# Patient Record
Sex: Male | Born: 1945 | Race: White | Hispanic: No | Marital: Married | Smoking: Never smoker
Health system: Southern US, Community
[De-identification: ages and names within clinical notes are randomized; demographics above are authoritative.]

## PROBLEM LIST (undated history)

## (undated) DIAGNOSIS — R972 Elevated prostate specific antigen [PSA]: Secondary | ICD-10-CM

## (undated) DIAGNOSIS — M5030 Other cervical disc degeneration, unspecified cervical region: Secondary | ICD-10-CM

## (undated) DIAGNOSIS — M1990 Unspecified osteoarthritis, unspecified site: Secondary | ICD-10-CM

## (undated) DIAGNOSIS — C61 Malignant neoplasm of prostate: Secondary | ICD-10-CM

## (undated) DIAGNOSIS — I10 Essential (primary) hypertension: Secondary | ICD-10-CM

## (undated) DIAGNOSIS — Z8709 Personal history of other diseases of the respiratory system: Secondary | ICD-10-CM

## (undated) DIAGNOSIS — E785 Hyperlipidemia, unspecified: Secondary | ICD-10-CM

## (undated) DIAGNOSIS — G4733 Obstructive sleep apnea (adult) (pediatric): Secondary | ICD-10-CM

## (undated) DIAGNOSIS — Z87442 Personal history of urinary calculi: Secondary | ICD-10-CM

## (undated) HISTORY — PX: TONSILLECTOMY: SUR1361

## (undated) HISTORY — PX: COLONOSCOPY: SHX174

---

## 1997-12-09 ENCOUNTER — Ambulatory Visit (HOSPITAL_COMMUNITY): Admission: RE | Admit: 1997-12-09 | Discharge: 1997-12-09 | Payer: Self-pay | Admitting: Internal Medicine

## 1998-04-17 ENCOUNTER — Ambulatory Visit (HOSPITAL_COMMUNITY): Admission: RE | Admit: 1998-04-17 | Discharge: 1998-04-17 | Payer: Self-pay | Admitting: Internal Medicine

## 1998-05-30 ENCOUNTER — Ambulatory Visit (HOSPITAL_COMMUNITY): Admission: RE | Admit: 1998-05-30 | Discharge: 1998-05-30 | Payer: Self-pay | Admitting: Internal Medicine

## 2001-07-11 ENCOUNTER — Encounter: Payer: Self-pay | Admitting: Internal Medicine

## 2001-07-11 ENCOUNTER — Encounter: Admission: RE | Admit: 2001-07-11 | Discharge: 2001-07-11 | Payer: Self-pay | Admitting: Internal Medicine

## 2002-02-03 ENCOUNTER — Ambulatory Visit (HOSPITAL_COMMUNITY): Admission: RE | Admit: 2002-02-03 | Discharge: 2002-02-03 | Payer: Self-pay | Admitting: Gastroenterology

## 2016-05-09 ENCOUNTER — Other Ambulatory Visit: Payer: Self-pay | Admitting: Urology

## 2016-05-22 ENCOUNTER — Encounter (HOSPITAL_BASED_OUTPATIENT_CLINIC_OR_DEPARTMENT_OTHER): Payer: Self-pay | Admitting: *Deleted

## 2016-05-22 NOTE — Progress Notes (Signed)
NPO AFTER MN .  ARRIVE AT 1115.  NEEDS ISTAT AND EKG.  WILL DO FLEET ENEMA AM DOS .

## 2016-05-24 NOTE — H&P (Signed)
--------------------------------------------------------------------------------   Thomas Barnes  [id number]  PRIMARY CARE:  Precious Reel, MD  DOB: November 09, 1945, 70 year old Male  REFERRING:  Precious Reel, MD  SSN: -[id number]  PROVIDER:  Raynelle Bring, M.D.    LOCATION:  [facility] [phone]   --------------------------------------------------------------------------------   CC/HPI: Elevated PSA    Thomas Barnes is a 70 year old gentleman seen today at the request of Dr. Virgina Jock for an elevated PSA. His medical comorbidities include hypercholesterolemia, hypertension, sleep apnea, and gout. He has a paternal family history of prostate cancer with his father having been diagnosed with symptomatic metastatic prostate cancer at age 77 and died of metastatic prostate cancer at age 59. He has been undergoing routine PSA screening under the care of Dr. Virgina Jock. Although I do not have his PSAs prior to this year,his PSA was apparently in the 1 range at baseline in apparently around 2 last year. We will plan to obtain these results. His PSA further increased to 3.30 on 01/04/16 and then increased to 3.6 with a free percentage of 19.4% when rechecked on 04/01/16. He has never undergone a prior prostate biopsy. He does not take 5 alpha reductase inhibitor medication. He denies baseline voiding symptoms. IPSS is 0. He does have erectile dysfunction. SHIM score is 2.     ALLERGIES: Penicillin    MEDICATIONS: Allopurinol 300 mg tablet  Lisinopril 20 mg tablet  Simvastatin 40 mg tablet  Aspirin Ec 81 mg tablet, delayed release  Fish Oil 1,000 mg (120 mg-180 mg) capsule     GU PSH: None   NON-GU PSH: Tonsillectomy - 1954    GU PMH: None   NON-GU PMH: Essential (primary) hypertension Gout, unspecified Pure hypercholesterolemia, unspecified Sleep apnea, unspecified    FAMILY HISTORY: ovarian cancer - Mother prostate cancer in father - Father   SOCIAL HISTORY: Marital  Status: Married Current Smoking Status: Patient has never smoked.  Has never drank.  Does not drink caffeine.    REVIEW OF SYSTEMS:    GU Review Male:   Patient denies frequent urination, hard to postpone urination, burning/ pain with urination, get up at night to urinate, leakage of urine, stream starts and stops, trouble starting your streams, and have to strain to urinate .  Gastrointestinal (Lower):   Patient denies diarrhea and constipation.  Gastrointestinal (Upper):   Patient denies nausea and vomiting.  Constitutional:   Patient denies fever, night sweats, weight loss, and fatigue.  Skin:   Patient denies skin rash/ lesion and itching.  Eyes:   Patient denies blurred vision and double vision.  Ears/ Nose/ Throat:   Patient denies sinus problems and sore throat.  Hematologic/Lymphatic:   Patient denies swollen glands and easy bruising.  Cardiovascular:   Patient denies leg swelling and chest pains.  Respiratory:   Patient denies cough and shortness of breath.  Endocrine:   Patient denies excessive thirst.  Musculoskeletal:   Patient denies back pain and joint pain.  Neurological:   Patient denies headaches and dizziness.  Psychologic:   Patient denies depression and anxiety.   VITAL SIGNS:      04/18/2016 10:03 AM  Weight 230 lb / 104.33 kg  Height 70 in / 177.8 cm  BP 114/71 mmHg  Pulse 70 /min  BMI 33.0 kg/m   GU PHYSICAL EXAMINATION:    Prostate: 40 cc. There is induration noted toward the right medial base of the prostate that is mildly suspicious.   MULTI-SYSTEM PHYSICAL EXAMINATION:  Constitutional: Well-nourished. No physical deformities. Normally developed. Good grooming.  Neck: Neck symmetrical, not swollen. Normal tracheal position.  Respiratory: No labored breathing, no use of accessory muscles.   Cardiovascular: Normal temperature, normal extremity pulses, no swelling, no varicosities.  Lymphatic: No enlargement of neck, axillae, groin.  Skin: No paleness,  no jaundice, no cyanosis. No lesion, no ulcer, no rash.  Neurologic / Psychiatric: Oriented to time, oriented to place, oriented to person. No depression, no anxiety, no agitation.  Gastrointestinal: No mass, no tenderness, no rigidity, non obese abdomen.  Eyes: Normal conjunctivae. Normal eyelids.  Ears, Nose, Mouth, and Throat: Left ear no scars, no lesions, no masses. Right ear no scars, no lesions, no masses. Nose no scars, no lesions, no masses. Normal hearing. Normal lips.  Musculoskeletal: Normal gait and station of head and neck.     PAST DATA REVIEWED:  Source Of History:  Patient  Lab Test Review:   PSA  Records Review:   Previous Patient Records  Urine Test Review:   Urinalysis   PROCEDURES:          Urinalysis - 81003 Dipstick Dipstick Cont'd  Specimen: Voided Bilirubin: Neg  Color: Yellow Ketones: Neg  Appearance: Clear Blood: Neg  Specific Gravity: 1.020 Protein: Neg  pH: 5.5 Urobilinogen: 0.2  Glucose: Neg Nitrites: Neg    Leukocyte Esterase: Neg    ASSESSMENT:      ICD-10 Details  1 GU:   Elevated PSA - R97.20    PLAN:            Medications New Meds: Levaquin 500 mg tablet 1 tablet PO Daily take 1 the day before prostate biopsy, 1 the day of, and 1 the day after  #3  0 Refill(s)            Schedule Return Visit: 1 Month - TRUSP          Document Letter(s):  Created for Patient: Clinical Summary         Notes:   1. Elevated PSA: Based on his digital rectal exam and rising PSA as well as his family history of prostate cancer, I have recommended that he proceed with a transrectal ultrasound-guided prostate needle biopsy in the near future. We have reviewed the potential risks including but not limited to bleeding, infection, failure to diagnose cancer, voiding difficulties, etc. He wishes to proceed and this will be arranged and scheduled.   Cc: Dr. Shon Baton

## 2016-05-25 ENCOUNTER — Ambulatory Visit (HOSPITAL_COMMUNITY): Payer: Medicare Other

## 2016-05-25 ENCOUNTER — Ambulatory Visit (HOSPITAL_BASED_OUTPATIENT_CLINIC_OR_DEPARTMENT_OTHER): Payer: Medicare Other | Admitting: Anesthesiology

## 2016-05-25 ENCOUNTER — Encounter (HOSPITAL_BASED_OUTPATIENT_CLINIC_OR_DEPARTMENT_OTHER)
Admission: RE | Disposition: A | Discharge status: Home / Self Care | Payer: Self-pay | Source: Ambulatory Visit | Attending: Urology

## 2016-05-25 ENCOUNTER — Ambulatory Visit (HOSPITAL_BASED_OUTPATIENT_CLINIC_OR_DEPARTMENT_OTHER)
Admission: RE | Admit: 2016-05-25 | Discharge: 2016-05-25 | Disposition: A | Discharge status: Home / Self Care | Payer: Medicare Other | Source: Ambulatory Visit | Attending: Urology | Admitting: Urology

## 2016-05-25 ENCOUNTER — Encounter (HOSPITAL_BASED_OUTPATIENT_CLINIC_OR_DEPARTMENT_OTHER): Payer: Self-pay | Admitting: *Deleted

## 2016-05-25 DIAGNOSIS — M109 Gout, unspecified: Secondary | ICD-10-CM | POA: Diagnosis not present

## 2016-05-25 DIAGNOSIS — N400 Enlarged prostate without lower urinary tract symptoms: Secondary | ICD-10-CM

## 2016-05-25 DIAGNOSIS — I10 Essential (primary) hypertension: Secondary | ICD-10-CM | POA: Insufficient documentation

## 2016-05-25 DIAGNOSIS — Z8042 Family history of malignant neoplasm of prostate: Secondary | ICD-10-CM | POA: Diagnosis not present

## 2016-05-25 DIAGNOSIS — Z79899 Other long term (current) drug therapy: Secondary | ICD-10-CM | POA: Insufficient documentation

## 2016-05-25 DIAGNOSIS — E7800 Pure hypercholesterolemia, unspecified: Secondary | ICD-10-CM | POA: Diagnosis not present

## 2016-05-25 DIAGNOSIS — R972 Elevated prostate specific antigen [PSA]: Secondary | ICD-10-CM | POA: Diagnosis present

## 2016-05-25 HISTORY — PX: PROSTATE BIOPSY: SHX241

## 2016-05-25 HISTORY — DX: Obstructive sleep apnea (adult) (pediatric): G47.33

## 2016-05-25 HISTORY — DX: Hyperlipidemia, unspecified: E78.5

## 2016-05-25 HISTORY — DX: Essential (primary) hypertension: I10

## 2016-05-25 HISTORY — DX: Elevated prostate specific antigen (PSA): R97.20

## 2016-05-25 LAB — POCT I-STAT, CHEM 8
BUN: 17 mg/dL (ref 6–20)
Calcium, Ion: 1.23 mmol/L (ref 1.15–1.40)
Chloride: 104 mmol/L (ref 101–111)
Creatinine, Ser: 1 mg/dL (ref 0.61–1.24)
Glucose, Bld: 141 mg/dL — ABNORMAL HIGH (ref 65–99)
HCT: 44 % (ref 39.0–52.0)
Hemoglobin: 15 g/dL (ref 13.0–17.0)
Potassium: 4.5 mmol/L (ref 3.5–5.1)
Sodium: 141 mmol/L (ref 135–145)
TCO2: 25 mmol/L (ref 0–100)

## 2016-05-25 SURGERY — BIOPSY, PROSTATE, RECTAL APPROACH, WITH US GUIDANCE
Anesthesia: Monitor Anesthesia Care

## 2016-05-25 MED ORDER — LIDOCAINE HCL 2 % IJ SOLN
INTRAMUSCULAR | Status: DC | PRN
Start: 2016-05-25 — End: 2016-05-25
  Administered 2016-05-25: 20 mL

## 2016-05-25 MED ORDER — PROMETHAZINE HCL 25 MG/ML IJ SOLN
6.2500 mg | INTRAMUSCULAR | Status: DC | PRN
  Filled 2016-05-25: qty 1

## 2016-05-25 MED ORDER — PROPOFOL 500 MG/50ML IV EMUL
INTRAVENOUS | Status: DC | PRN
  Administered 2016-05-25: 200 ug/kg/min via INTRAVENOUS

## 2016-05-25 MED ORDER — FLEET ENEMA 7-19 GM/118ML RE ENEM
1.0000 | ENEMA | Freq: Once | RECTAL | Status: DC
  Filled 2016-05-25: qty 1

## 2016-05-25 MED ORDER — GENTAMICIN IN SALINE 1.6-0.9 MG/ML-% IV SOLN
80.0000 mg | INTRAVENOUS | Status: DC
  Filled 2016-05-25: qty 50

## 2016-05-25 MED ORDER — GENTAMICIN SULFATE 40 MG/ML IJ SOLN
5.0000 mg/kg | INTRAMUSCULAR | Status: AC
  Administered 2016-05-25: 430 mg via INTRAVENOUS
  Filled 2016-05-25 (×2): qty 10.75

## 2016-05-25 MED ORDER — LIDOCAINE HCL 2 % IJ SOLN
INTRAMUSCULAR | Status: AC
  Filled 2016-05-25: qty 20

## 2016-05-25 MED ORDER — DEXAMETHASONE SODIUM PHOSPHATE 10 MG/ML IJ SOLN
INTRAMUSCULAR | Status: AC
  Filled 2016-05-25: qty 1

## 2016-05-25 MED ORDER — BELLADONNA ALKALOIDS-OPIUM 16.2-60 MG RE SUPP
RECTAL | Status: AC
  Filled 2016-05-25: qty 1

## 2016-05-25 MED ORDER — LIDOCAINE HCL 2 % EX GEL
CUTANEOUS | Status: AC
Start: 2016-05-25 — End: 2016-05-25
  Filled 2016-05-25: qty 5

## 2016-05-25 MED ORDER — ONDANSETRON HCL 4 MG/2ML IJ SOLN
INTRAMUSCULAR | Status: AC
  Filled 2016-05-25: qty 2

## 2016-05-25 MED ORDER — DEXAMETHASONE SODIUM PHOSPHATE 4 MG/ML IJ SOLN
INTRAMUSCULAR | Status: DC | PRN
  Administered 2016-05-25: 10 mg via INTRAVENOUS

## 2016-05-25 MED ORDER — HYDROMORPHONE HCL 1 MG/ML IJ SOLN
0.2500 mg | INTRAMUSCULAR | Status: DC | PRN
  Filled 2016-05-25: qty 1

## 2016-05-25 MED ORDER — ONDANSETRON HCL 4 MG/2ML IJ SOLN: INTRAMUSCULAR | Status: DC | PRN

## 2016-05-25 MED ORDER — ONDANSETRON HCL 4 MG/2ML IJ SOLN
INTRAMUSCULAR | Status: DC | PRN
  Administered 2016-05-25: 4 mg via INTRAVENOUS

## 2016-05-25 MED ORDER — LIDOCAINE 2% (20 MG/ML) 5 ML SYRINGE
INTRAMUSCULAR | Status: DC | PRN
  Administered 2016-05-25: 50 mg via INTRAVENOUS

## 2016-05-25 MED ORDER — LIDOCAINE HCL 2 % EX GEL
CUTANEOUS | Status: DC | PRN
  Administered 2016-05-25: 1

## 2016-05-25 MED ORDER — FENTANYL CITRATE (PF) 100 MCG/2ML IJ SOLN
INTRAMUSCULAR | Status: AC
  Filled 2016-05-25: qty 2

## 2016-05-25 MED ORDER — MIDAZOLAM HCL 2 MG/2ML IJ SOLN
INTRAMUSCULAR | Status: AC
  Filled 2016-05-25: qty 2

## 2016-05-25 MED ORDER — LACTATED RINGERS IV SOLN
INTRAVENOUS | Status: DC
  Administered 2016-05-25: 11:00:00 via INTRAVENOUS
  Filled 2016-05-25: qty 1000

## 2016-05-25 MED ORDER — MIDAZOLAM HCL 5 MG/5ML IJ SOLN
INTRAMUSCULAR | Status: DC | PRN
  Administered 2016-05-25: 2 mg via INTRAVENOUS

## 2016-05-25 MED ORDER — FENTANYL CITRATE (PF) 100 MCG/2ML IJ SOLN
INTRAMUSCULAR | Status: DC | PRN
  Administered 2016-05-25: 25 ug via INTRAVENOUS

## 2016-05-25 SURGICAL SUPPLY — 13 items
DRSG TELFA 3X8 NADH (GAUZE/BANDAGES/DRESSINGS) ×3 IMPLANT
GLOVE BIO SURGEON STRL SZ7.5 (GLOVE) ×3 IMPLANT
INST BIOPSY MAXCORE 18GX25 (NEEDLE) ×3 IMPLANT
INSTR BIOPSY MAXCORE 18GX20 (NEEDLE) IMPLANT
KIT ROOM TURNOVER WOR (KITS) ×3 IMPLANT
NDL SAFETY ECLIPSE 18X1.5 (NEEDLE) ×1 IMPLANT
NEEDLE HYPO 18GX1.5 SHARP (NEEDLE) ×2
NEEDLE SPNL 22GX7 QUINCKE BK (NEEDLE) ×3 IMPLANT
SURGILUBE 2OZ TUBE FLIPTOP (MISCELLANEOUS) ×3 IMPLANT
SYR 20CC LL (SYRINGE) ×3 IMPLANT
TOWEL OR 17X24 6PK STRL BLUE (TOWEL DISPOSABLE) IMPLANT
UNDERPAD 30X30 INCONTINENT (UNDERPADS AND DIAPERS) ×6 IMPLANT
WATER STERILE IRR 500ML POUR (IV SOLUTION) IMPLANT

## 2016-05-25 NOTE — Anesthesia Procedure Notes (Signed)
Procedure Name: MAC Date/Time: 05/25/2016 12:45 PM Performed by: Wanita Chamberlain Pre-anesthesia Checklist: Patient identified, Timeout performed, Emergency Drugs available, Suction available and Patient being monitored Patient Re-evaluated:Patient Re-evaluated prior to inductionOxygen Delivery Method: Nasal cannula Preoxygenation: Pre-oxygenation with 100% oxygen Intubation Type: IV induction Placement Confirmation: positive ETCO2 and breath sounds checked- equal and bilateral Dental Injury: Teeth and Oropharynx as per pre-operative assessment

## 2016-05-25 NOTE — Anesthesia Postprocedure Evaluation (Signed)
Anesthesia Post Note  Patient: Thomas Barnes  Procedure(s) Performed: Procedure(s) (LRB): BIOPSY TRANSRECTAL ULTRASONIC PROSTATE (TUBP) (N/A)  Patient location during evaluation: PACU Anesthesia Type: General Level of consciousness: awake and alert and patient cooperative Pain management: pain level controlled Vital Signs Assessment: post-procedure vital signs reviewed and stable Respiratory status: spontaneous breathing and respiratory function stable Cardiovascular status: stable Anesthetic complications: no    Last Vitals:  Vitals:   05/25/16 1345 05/25/16 1400  BP: 122/71 137/86  Pulse: 65 66  Resp: 13 (!) 21  Temp:      Last Pain:  Vitals:   05/25/16 1106  TempSrc: Oral                 Hedy Garro S

## 2016-05-25 NOTE — Transfer of Care (Signed)
Immediate Anesthesia Transfer of Care Note  Patient: Thomas Barnes  Procedure(s) Performed: Procedure(s): BIOPSY TRANSRECTAL ULTRASONIC PROSTATE (TUBP) (N/A)  Patient Location: PACU  Anesthesia Type:MAC  Level of Consciousness: awake, alert , oriented and patient cooperative  Airway & Oxygen Therapy: Patient Spontanous Breathing and Patient connected to nasal cannula oxygen  Post-op Assessment: Report given to RN and Post -op Vital signs reviewed and stable  Post vital signs: Reviewed and stable  Last Vitals:  Vitals:   05/25/16 1106 05/25/16 1328  BP: 135/81 123/81  Pulse: 67 69  Resp: 16 10  Temp: 36.7 C (P) 36.3 C    Last Pain:  Vitals:   05/25/16 1106  TempSrc: Oral      Patients Stated Pain Goal: 3 ([id number])  Complications: No apparent anesthesia complications

## 2016-05-25 NOTE — Op Note (Signed)
Preoperative diagnosis: Elevated PSA, abnormal digital rectal exam  Postoperative diagnosis: Elevated PSA, abnormal digital rectal exam  Procedure: Transrectal ultrasound-guided prostate needle biopsy  Surgeon: Pryor Curia. M.D.  Resident: Dr. Cleotis Lema  Anesthesia: IV sedation  EBL: Minimal  Indication: Thomas Barnes is a 70 year old gentleman who is noted to have induration at the right medial base of the prostate and a rising PSA.  He presents today for a transrectal ultrasound-guided prostate needle biopsy.  We reviewed potential complications and risks and he gives informed consent.  Description of procedure:  The patient was taken to the operating room and IV sedation was administered.  He was placed in the left lateral decubitus position and administered topical lidocaine in the rectum.  Preoperative antibiotics were administered.  A preoperative timeout was performed.   The ultrasound probe was then inserted into the rectum and the prostate was visualized.  2% Xylocaine was injected at the junction of the prostate and seminal vesicles with a total of 5 cc on each side thereby resulting in a periprostatic nerve block.  Additional Xylocaine was injected at the apex and along the rectal wall.  The prostate was then examined and measured 56 cc.  There were scattered calcifications but no discrete abnormalities otherwise noted.  There was a small intravesical component of the prostate.  A 12 core biopsy was then obtained.  Systematic biopsies were obtained from the right and left prostate including the base, mid, and apical gland in both the lateral and parasagittal regions.  All biopsies were obtained under direct ultrasound guidance.  The patient tolerated the procedure well without complications.  All biopsies were sent for permanent pathologic analysis to the pathology lab in formalin.

## 2016-05-25 NOTE — Discharge Instructions (Signed)
Post Anesthesia Home Care Instructions  Activity: Get plenty of rest for the remainder of the day. A responsible adult should stay with you for 24 hours following the procedure.  For the next 24 hours, DO NOT: -Drive a car -Paediatric nurse -Drink alcoholic beverages -Take any medication unless instructed by your physician -Make any legal decisions or sign important papers.  Meals: Start with liquid foods such as gelatin or soup. Progress to regular foods as tolerated. Avoid greasy, spicy, heavy foods. If nausea and/or vomiting occur, drink only clear liquids until the nausea and/or vomiting subsides. Call your physician if vomiting continues.  Special Instructions/Symptoms: Your throat may feel dry or sore from the anesthesia or the breathing tube placed in your throat during surgery. If this causes discomfort, gargle with warm salt water. The discomfort should disappear within 24 hours.  Transrectal Ultrasound-Guided Biopsy A transrectal ultrasound-guided biopsy is a procedure to remove samples of tissue from your prostate using ultrasound images to guide the procedure. The procedure is usually done to evaluate the prostate gland of men who have an elevated prostate-specific antigen (PSA). PSA is a blood test to screen for prostate cancer. The biopsy samples are taken to check for prostate cancer.  LET [facility] CARE PROVIDER KNOW ABOUT:  Any allergies you have.  All medicines you are taking, including vitamins, herbs, eye drops, creams, and over-the-counter medicines.  Previous problems you or members of your family have had with the use of anesthetics.  Any blood disorders you have.  Previous surgeries you have had.  Medical conditions you have. RISKS AND COMPLICATIONS Generally, this is a safe procedure. However, as with any procedure, problems can occur. Possible problems include:  Infection of your prostate.  Bleeding from your rectum or blood in your  urine.  Difficulty urinating.  Nerve damage (this is usually temporary).  Damage to surrounding structures such as blood vessels, organs, and muscles, which would require other procedures. BEFORE THE PROCEDURE  Do not eat or drink anything after midnight on the night before the procedure or as directed by your health care provider.  Take medicines only as directed by your health care provider.  Your health care provider may have you stop taking certain medicines 5-7 days before the procedure.  You will be given an enema before the procedure. During an enema, a liquid is injected into your rectum to clear out waste.  You may have lab tests the day of your procedure.   Plan to have someone take you home after the procedure. PROCEDURE   You will be given medicine to help you relax (sedative) before the procedure. An IV tube will be inserted into one of your veins and used to give fluids and medicine.  You will be given antibiotic medicine to reduce the risk of an infection.  You will be placed on your side for the procedure.  A probe with lubricated gel will be placed into your rectum, and images will be taken of your prostate and surrounding structures.  Numbing medicine will be injected into the prostate before the biopsy samples are taken.  A biopsy needle will then be inserted and guided to your prostate with the use of the ultrasound images.  Samples of prostate tissue will be taken, and the needle will then be removed.  The biopsy samples will be sent to a lab to be analyzed. Results are usually back in 2-3 days. AFTER THE PROCEDURE  You will be taken to a recovery area where you  will be monitored.  You may have some discomfort in the rectal area. You will be given pain medicines to control this.  You may be allowed to go home the same day, or you may need to stay in the hospital overnight.   This information is not intended to replace advice given to you by your  health care provider. Make sure you discuss any questions you have with your health care provider.   Document Released: 12/22/2013 Document Revised: 08/28/2014 Document Reviewed: 12/22/2013 Elsevier Interactive Patient Education 2016 [employer]. Call if fever > 101 or excessive bleeding.

## 2016-05-25 NOTE — Anesthesia Preprocedure Evaluation (Addendum)
Anesthesia Evaluation  Patient identified by MRN, date of birth, ID band Patient awake    Reviewed: Allergy & Precautions, NPO status , Patient's Chart, lab work & pertinent test results  History of Anesthesia Complications Negative for: history of anesthetic complications  Airway Mallampati: III  TM Distance: >3 FB Neck ROM: Full    Dental no notable dental hx. (+) Dental Advisory Given, Teeth Intact   Pulmonary sleep apnea ,    Pulmonary exam normal        Cardiovascular Exercise Tolerance: Good hypertension, Pt. on medications Normal cardiovascular exam Rhythm:Regular Rate:Normal     Neuro/Psych negative neurological ROS  negative psych ROS   GI/Hepatic negative GI ROS, Neg liver ROS,   Endo/Other  negative endocrine ROS  Renal/GU      Musculoskeletal   Abdominal   Peds  Hematology   Anesthesia Other Findings   Reproductive/Obstetrics                          Anesthesia Physical Anesthesia Plan  ASA: II  Anesthesia Plan: MAC   Post-op Pain Management:    Induction:   Airway Management Planned: Natural Airway and Simple Face Mask  Additional Equipment:   Intra-op Plan:   Post-operative Plan:   Informed Consent: I have reviewed the patients History and Physical, chart, labs and discussed the procedure including the risks, benefits and alternatives for the proposed anesthesia with the patient or authorized representative who has indicated his/her understanding and acceptance.   Dental advisory given  Plan Discussed with: Anesthesiologist  Anesthesia Plan Comments:        Anesthesia Quick Evaluation

## 2016-05-26 ENCOUNTER — Encounter (HOSPITAL_BASED_OUTPATIENT_CLINIC_OR_DEPARTMENT_OTHER): Payer: Self-pay | Admitting: Urology

## 2017-05-17 ENCOUNTER — Ambulatory Visit: Payer: Self-pay | Admitting: Orthopedic Surgery

## 2017-05-17 NOTE — H&P (Signed)
Thomas Barnes DOB: [birth year] Married / Language: English / Race: White Male Date of Admission:  06/06/2017 CC: Left Hip Pain History of Present Illness The patient is a 71 year old male who comes in for a preoperative History and Physical. The patient is scheduled for a left total hip arthroplasty (anterior) to be performed by Dr. [name]. [name], MD at [facility] on 06-06-2017. The patient is being followed for their left hip pain and osteoarthritis. They are months out from IA injection with Dr.[name]. Symptoms reported include: pain, pain after sitting, catching, pain with weightbearing and difficulty ambulating. and report their pain level to be mild to moderate. The following medication has been used for pain control: Aleve. The patient has not gotten any relief of their symptoms with Cortisone injections. Mr. Thomas Barnes has been diagnosed with some osteoarthritis of the left hip in the past. He had an intra-articular injection in January by Dr. [name] which relieved his pain for about 3-4 months. He had a second injection this past May did not help as much. He has been having pain for about a year now. He denies any pain at rest or at night. There is no pain in the hip when he sitting in his recliner. He does have pain upon transferring to a standing position but it will ease off a little bit with some activity. He does have more pain if he has long periods of inactivity. Pain with weightbearing and standing and more recently it was quite severe prompting him to call him for this appointment today. He has been using some Aleve over-the-counter the past couple of days which has helped a little bit.  Radiographs-AP pelvis and lateral of the LEFT hip show that he has now progressed to bone-on-bone changes in the LEFT hip. The arthritis has progressed significantly since last December. He is having more functional problems now in the last injection did not help much. He and his wife  are both very concerned with the radiographic progression and also given that that he did not have a good response to the last injection, they opted to go ahead and proceed with total hip arthroplasty. They have been treated conservatively in the past for the above stated problem and despite conservative measures, they continue to have progressive pain and severe functional limitations and dysfunction. They have failed non-operative management including home exercise, medications, and injections. It is felt that they would benefit from undergoing total joint replacement. Risks and benefits of the procedure have been discussed with the patient and they elect to proceed with surgery. There are no active contraindications to surgery such as ongoing infection or rapidly progressive neurological disease.   Problem List/Past Medical  Primary osteoarthritis of left hip (M16.12)  Gout  High blood pressure  Hypercholesterolemia  Sleep Apnea  Elevated PSA  Measles  Mumps     Allergies  PENICILLIN [02/17/2011]: Hives.  Family History Cancer  Father, Mother. Heart Disease  Father. Hypertension  Father.  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 08/11/2016 Post-Surgical Plans  Home With Family.  Medication History Simvastatin (40MG  Tablet, Oral) Active. Lisinopril (20MG  Tablet, Oral) Active. Allopurinol (300MG  Tablet, Oral) Active. Aspirin (81MG  Tablet, 1 (  one) Oral) Active. Fish Oil Concentrate (435MG  Capsule, 1 (one) Oral) Active.  Past Surgical History Tonsillectomy   Review of Systems General Not Present- Chills, Fatigue, Fever, Memory Loss, Night Sweats, Weight  Gain and Weight Loss. Skin Not Present- Eczema, Hives, Itching, Lesions and Rash. HEENT Not Present- Dentures, Double Vision, Headache, Hearing Loss, Tinnitus and Visual Loss. Respiratory Not Present- Allergies, Chronic Cough, Coughing up blood, Shortness of breath at rest and Shortness of breath with exertion. Cardiovascular Not Present- Chest Pain, Difficulty Breathing Lying Down, Murmur, Palpitations, Racing/skipping heartbeats and Swelling. Gastrointestinal Not Present- Abdominal Pain, Bloody Stool, Constipation, Diarrhea, Difficulty Swallowing, Heartburn, Jaundice, Loss of appetitie, Nausea and Vomiting. Male Genitourinary Not Present- Blood in Urine, Discharge, Flank Pain, Incontinence, Painful Urination, Urgency, Urinary frequency, Urinary Retention, Urinating at Night and Weak urinary stream. Musculoskeletal Present- Joint Pain. Not Present- Back Pain, Joint Swelling, Morning Stiffness, Muscle Pain, Muscle Weakness and Spasms. Neurological Not Present- Blackout spells, Difficulty with balance, Dizziness, Paralysis, Tremor and Weakness. Psychiatric Not Present- Insomnia.  Vitals Weight: 212 lb Height: 70in Body Surface Area: 2.14 m Body Mass Index: 30.42 kg/m  Pulse: 60 (Regular)  BP: 122/62 (Sitting, Right Arm, Standard)       Physical Exam  General Mental Status -Alert, cooperative and good historian. General Appearance-pleasant, Not in acute distress. Orientation-Oriented X3. Build & Nutrition-Well nourished and Well developed.  Head and Neck Head-normocephalic, atraumatic . Neck Global Assessment - supple, no bruit auscultated on the right, no bruit auscultated on the left.  Eye Pupil - Bilateral-Regular and Round. Motion - Bilateral-EOMI.  Chest and Lung Exam Auscultation Breath sounds - clear at anterior chest wall and clear at posterior chest wall. Adventitious sounds - No Adventitious sounds.  Cardiovascular Auscultation Rhythm -  Regular rate and rhythm. Heart Sounds - S1 WNL and S2 WNL. Murmurs & Other Heart Sounds - Auscultation of the heart reveals - No Murmurs.  Abdomen Palpation/Percussion Tenderness - Abdomen is non-tender to palpation. Rigidity (guarding) - Abdomen is soft. Auscultation Auscultation of the abdomen reveals - Bowel sounds normal.  Male Genitourinary Note: Not done, not pertinent to present illness   Musculoskeletal Note: Examination of the right hip shows flexion to 120 rotation in 30 abduction 40 and external rotation of 40. There is no tenderness over the greater trochanter. There is no pain on provocative testing of the hip. His LEFT hip can be flexed to 100 rotate and 5 out 30 and abduct 30 with discomfort on range of motion. He does not have any trochanteric tenderness. He has an antalgic gait pattern on the LEFT.  Radiographs-AP pelvis and lateral of the LEFT hip show that he has now progressed to bone-on-bone changes in the LEFT hip. This is a substantial progression compared to his x-rays from last December.   Assessment & Plan Primary osteoarthritis of left hip (M16.12)  Note:Surgical Plans: Left Total Hip Replacement - Anterior Approach  Disposition: Home with HHPT  PCP: Dr. Virgina Jock  IV TXA  Anesthesia Issues: None  Patient was instructed on what medications to stop prior to surgery.  Signed electronically by Joelene Millin, III PA-C

## 2017-05-22 ENCOUNTER — Ambulatory Visit: Payer: Self-pay | Admitting: Orthopedic Surgery

## 2017-05-29 NOTE — Patient Instructions (Signed)
Thomas Barnes  05/29/2017   Your procedure is scheduled on: 06/06/2017    Report to [facility] Main  Entrance   Report to admitting at   0900 AM   Call this number if you have problems the morning of surgery  [phone]   Remember: ONLY 1 PERSON MAY GO WITH YOU TO SHORT STAY TO GET  READY MORNING OF YOUR SURGERY.  Do not eat food or drink liquids :After Midnight.     Take these medicines the morning of surgery with A SIP OF WATER: Allopurinol                                 You may not have any metal on your body including hair pins and           .              Men may shave face and neck.   Do not bring valuables to the hospital. [place].  Contacts, dentures or bridgework may not be worn into surgery.  Leave suitcase in the car. After surgery it may be brought to your room.                   Please read over the following fact sheets you were given: _____________________________________________________________________             [facility] - Preparing for Surgery Before surgery, you can play an important role.  Because skin is not sterile, your skin needs to be as free of germs as possible.  You can reduce the number of germs on your skin by washing with CHG (chlorahexidine gluconate) soap before surgery.  CHG is an antiseptic cleaner which kills germs and bonds with the skin to continue killing germs even after washing. Please DO NOT use if you have an allergy to CHG or antibacterial soaps.  If your skin becomes reddened/irritated stop using the CHG and inform your nurse when you arrive at Short Stay. Do not shave (including legs and underarms) for at least 48 hours prior to the first CHG shower.  You may shave your face/neck. Please follow these instructions carefully:  1.  Shower with CHG Soap the night before surgery and the  morning of Surgery.  2.  If you choose to wash your hair, wash  your hair first as usual with your  normal  shampoo.  3.  After you shampoo, rinse your hair and body thoroughly to remove the  shampoo.                           4.  Use CHG as you would any other liquid soap.  You can apply chg directly  to the skin and wash                       Gently with a scrungie or clean washcloth.  5.  Apply the CHG Soap to your body ONLY FROM THE NECK DOWN.   Do not use on face/ open  Wound or open sores. Avoid contact with eyes, ears mouth and genitals (private parts).                       Wash face,  Genitals (private parts) with your normal soap.             6.  Wash thoroughly, paying special attention to the area where your surgery  will be performed.  7.  Thoroughly rinse your body with warm water from the neck down.  8.  DO NOT shower/wash with your normal soap after using and rinsing off  the CHG Soap.                9.  Pat yourself dry with a clean towel.            10.  Wear clean pajamas.            11.  Place clean sheets on your bed the night of your first shower and do not  sleep with pets. Day of Surgery : Do not apply any lotions/deodorants the morning of surgery.  Please wear clean clothes to the hospital/surgery center.  FAILURE TO FOLLOW THESE INSTRUCTIONS MAY RESULT IN THE CANCELLATION OF YOUR SURGERY PATIENT SIGNATURE_________________________________  NURSE SIGNATURE__________________________________  ________________________________________________________________________  WHAT IS A BLOOD TRANSFUSION? Blood Transfusion Information  A transfusion is the replacement of blood or some of its parts. Blood is made up of multiple cells which provide different functions.  Red blood cells carry oxygen and are used for blood loss replacement.  White blood cells fight against infection.  Platelets control bleeding.  Plasma helps clot blood.  Other blood products are available for specialized needs, such as hemophilia  or other clotting disorders. BEFORE THE TRANSFUSION  Who gives blood for transfusions?   Healthy volunteers who are fully evaluated to make sure their blood is safe. This is blood bank blood. Transfusion therapy is the safest it has ever been in the practice of medicine. Before blood is taken from a donor, a complete history is taken to make sure that person has no history of diseases nor engages in risky social behavior (examples are intravenous drug use or sexual activity with multiple partners). The donor's travel history is screened to minimize risk of transmitting infections, such as malaria. The donated blood is tested for signs of infectious diseases, such as HIV and hepatitis. The blood is then tested to be sure it is compatible with you in order to minimize the chance of a transfusion reaction. If you or a relative donates blood, this is often done in anticipation of surgery and is not appropriate for emergency situations. It takes many days to process the donated blood. RISKS AND COMPLICATIONS Although transfusion therapy is very safe and saves many lives, the main dangers of transfusion include:   Getting an infectious disease.  Developing a transfusion reaction. This is an allergic reaction to something in the blood you were given. Every precaution is taken to prevent this. The decision to have a blood transfusion has been considered carefully by your caregiver before blood is given. Blood is not given unless the benefits outweigh the risks. AFTER THE TRANSFUSION  Right after receiving a blood transfusion, you will usually feel much better and more energetic. This is especially true if your red blood cells have gotten low (anemic). The transfusion raises the level of the red blood cells which carry oxygen, and this usually causes an energy increase.  The  nurse administering the transfusion will monitor you carefully for complications. HOME CARE INSTRUCTIONS  No special instructions are  needed after a transfusion. You may find your energy is better. Speak with your caregiver about any limitations on activity for underlying diseases you may have. SEEK MEDICAL CARE IF:   Your condition is not improving after your transfusion.  You develop redness or irritation at the intravenous (IV) site. SEEK IMMEDIATE MEDICAL CARE IF:  Any of the following symptoms occur over the next 12 hours:  Shaking chills.  You have a temperature by mouth above 102 F (38.9 C), not controlled by medicine.  Chest, back, or muscle pain.  People around you feel you are not acting correctly or are confused.  Shortness of breath or difficulty breathing.  Dizziness and fainting.  You get a rash or develop hives.  You have a decrease in urine output.  Your urine turns a dark color or changes to pink, red, or brown. Any of the following symptoms occur over the next 10 days:  You have a temperature by mouth above 102 F (38.9 C), not controlled by medicine.  Shortness of breath.  Weakness after normal activity.  The [place]roz part of the eye turns yellow (jaundice).  You have a decrease in the amount of urine or are urinating less often.  Your urine turns a dark color or changes to pink, red, or brown. Document Released: 08/04/2000 Document Revised: 10/30/2011 Document Reviewed: 03/23/2008 ExitCare Patient Information 2014 [place].  _______________________________________________________________________  Incentive Spirometer  An incentive spirometer is a tool that can help keep your lungs clear and active. This tool measures how well you are filling your lungs with each breath. Taking long deep breaths may help reverse or decrease the chance of developing breathing (pulmonary) problems (especially infection) following:  A long period of time when you are unable to move or be active. BEFORE THE PROCEDURE   If the spirometer includes an indicator to show your best effort, your  nurse or respiratory therapist will set it to a desired goal.  If possible, sit up straight or lean slightly forward. Try not to slouch.  Hold the incentive spirometer in an upright position. INSTRUCTIONS FOR USE  1. Sit on the edge of your bed if possible, or sit up as far as you can in bed or on a chair. 2. Hold the incentive spirometer in an upright position. 3. Breathe out normally. 4. Place the mouthpiece in your mouth and seal your lips tightly around it. 5. Breathe in slowly and as deeply as possible, raising the piston or the ball toward the top of the column. 6. Hold your breath for 3-5 seconds or for as long as possible. Allow the piston or ball to fall to the bottom of the column. 7. Remove the mouthpiece from your mouth and breathe out normally. 8. Rest for a few seconds and repeat Steps 1 through 7 at least 10 times every 1-2 hours when you are awake. Take your time and take a few normal breaths between deep breaths. 9. The spirometer may include an indicator to show your best effort. Use the indicator as a goal to work toward during each repetition. 10. After each set of 10 deep breaths, practice coughing to be sure your lungs are clear. If you have an incision (the cut made at the time of surgery), support your incision when coughing by placing a pillow or rolled up towels firmly against it. Once you are able to get  out of bed, walk around indoors and cough well. You may stop using the incentive spirometer when instructed by your caregiver.  RISKS AND COMPLICATIONS  Take your time so you do not get dizzy or light-headed.  If you are in pain, you may need to take or ask for pain medication before doing incentive spirometry. It is harder to take a deep breath if you are having pain. AFTER USE  Rest and breathe slowly and easily.  It can be helpful to keep track of a log of your progress. Your caregiver can provide you with a simple table to help with this. If you are using the  spirometer at home, follow these instructions: [place] IF:   You are having difficultly using the spirometer.  You have trouble using the spirometer as often as instructed.  Your pain medication is not giving enough relief while using the spirometer.  You develop fever of 100.5 F (38.1 C) or higher. SEEK IMMEDIATE MEDICAL CARE IF:   You cough up bloody sputum that had not been present before.  You develop fever of 102 F (38.9 C) or greater.  You develop worsening pain at or near the incision site. MAKE SURE YOU:   Understand these instructions.  Will watch your condition.  Will get help right away if you are not doing well or get worse. Document Released: 12/18/2006 Document Revised: 10/30/2011 Document Reviewed: 02/18/2007 [facility] Patient Information 2014 [place], [place].   ________________________________________________________________________

## 2017-05-30 ENCOUNTER — Encounter (HOSPITAL_COMMUNITY)
Admission: RE | Admit: 2017-05-30 | Discharge: 2017-05-30 | Disposition: A | Discharge status: Home / Self Care | Payer: Medicare Other | Source: Ambulatory Visit | Attending: Orthopedic Surgery | Admitting: Orthopedic Surgery

## 2017-05-30 ENCOUNTER — Encounter (HOSPITAL_COMMUNITY): Payer: Self-pay

## 2017-05-30 DIAGNOSIS — M1612 Unilateral primary osteoarthritis, left hip: Secondary | ICD-10-CM | POA: Insufficient documentation

## 2017-05-30 DIAGNOSIS — R001 Bradycardia, unspecified: Secondary | ICD-10-CM | POA: Diagnosis not present

## 2017-05-30 DIAGNOSIS — Z01812 Encounter for preprocedural laboratory examination: Secondary | ICD-10-CM | POA: Diagnosis not present

## 2017-05-30 DIAGNOSIS — Z01810 Encounter for preprocedural cardiovascular examination: Secondary | ICD-10-CM | POA: Diagnosis not present

## 2017-05-30 HISTORY — DX: Unspecified osteoarthritis, unspecified site: M19.90

## 2017-05-30 LAB — PROTIME-INR
INR: 0.99
Prothrombin Time: 13 seconds (ref 11.4–15.2)

## 2017-05-30 LAB — COMPREHENSIVE METABOLIC PANEL
ALT: 16 U/L — ABNORMAL LOW (ref 17–63)
AST: 18 U/L (ref 15–41)
Albumin: 4.5 g/dL (ref 3.5–5.0)
Alkaline Phosphatase: 79 U/L (ref 38–126)
Anion gap: 11 (ref 5–15)
BUN: 15 mg/dL (ref 6–20)
CO2: 24 mmol/L (ref 22–32)
Calcium: 9.4 mg/dL (ref 8.9–10.3)
Chloride: 103 mmol/L (ref 101–111)
Creatinine, Ser: 0.97 mg/dL (ref 0.61–1.24)
GFR calc Af Amer: >60 mL/min (ref >60)
GFR calc non Af Amer: >60 mL/min (ref >60)
Glucose, Bld: 136 mg/dL — ABNORMAL HIGH (ref 65–99)
Potassium: 4.5 mmol/L (ref 3.5–5.1)
Sodium: 138 mmol/L (ref 135–145)
Total Bilirubin: 0.6 mg/dL (ref 0.3–1.2)
Total Protein: 7.4 g/dL (ref 6.5–8.1)

## 2017-05-30 LAB — CBC
HCT: 40.2 % (ref 39.0–52.0)
Hemoglobin: 14.1 g/dL (ref 13.0–17.0)
MCH: 28.4 pg (ref 26.0–34.0)
MCHC: 35.1 g/dL (ref 30.0–36.0)
MCV: 81 fL (ref 78.0–100.0)
Platelets: 279 10*3/uL (ref 150–400)
RBC: 4.96 MIL/uL (ref 4.22–5.81)
RDW: 13.7 % (ref 11.5–15.5)
WBC: 5.8 10*3/uL (ref 4.0–10.5)

## 2017-05-30 LAB — SURGICAL PCR SCREEN
MRSA, PCR: NEGATIVE
Staphylococcus aureus: NEGATIVE

## 2017-05-30 LAB — APTT: aPTT: 31 seconds (ref 24–36)

## 2017-05-30 LAB — ABO/RH: ABO/RH(D): A POS

## 2017-05-30 NOTE — Progress Notes (Signed)
Clearance dated 01/12/2017- Dr Virgina Jock on chart LOV - Dr Virgina Jock- 01/12/2017 on chart

## 2017-05-30 NOTE — Progress Notes (Signed)
Called and requesed t LOV note from Dr Virgina Jock and clearance for surgery.  Office to fax.

## 2017-05-30 NOTE — Progress Notes (Signed)
Final EKG done 05/30/17-epic

## 2017-06-06 ENCOUNTER — Encounter (HOSPITAL_COMMUNITY)
Admission: RE | Disposition: A | Discharge status: Home Health | Payer: Self-pay | Source: Ambulatory Visit | Attending: Orthopedic Surgery

## 2017-06-06 ENCOUNTER — Encounter (HOSPITAL_COMMUNITY): Payer: Self-pay | Admitting: Certified Registered"

## 2017-06-06 ENCOUNTER — Inpatient Hospital Stay (HOSPITAL_COMMUNITY)
Admission: RE | Admit: 2017-06-06 | Discharge: 2017-06-08 | DRG: 470 | Disposition: A | Discharge status: Home Health | Payer: Medicare Other | Source: Ambulatory Visit | Attending: Orthopedic Surgery | Admitting: Orthopedic Surgery

## 2017-06-06 ENCOUNTER — Inpatient Hospital Stay (HOSPITAL_COMMUNITY): Payer: Medicare Other

## 2017-06-06 ENCOUNTER — Inpatient Hospital Stay (HOSPITAL_COMMUNITY): Payer: Medicare Other | Admitting: Certified Registered"

## 2017-06-06 DIAGNOSIS — M1612 Unilateral primary osteoarthritis, left hip: Principal | ICD-10-CM | POA: Diagnosis present

## 2017-06-06 DIAGNOSIS — E7800 Pure hypercholesterolemia, unspecified: Secondary | ICD-10-CM | POA: Diagnosis present

## 2017-06-06 DIAGNOSIS — M109 Gout, unspecified: Secondary | ICD-10-CM | POA: Diagnosis present

## 2017-06-06 DIAGNOSIS — Z880 Allergy status to penicillin: Secondary | ICD-10-CM

## 2017-06-06 DIAGNOSIS — M169 Osteoarthritis of hip, unspecified: Secondary | ICD-10-CM | POA: Diagnosis present

## 2017-06-06 DIAGNOSIS — Z8249 Family history of ischemic heart disease and other diseases of the circulatory system: Secondary | ICD-10-CM | POA: Diagnosis not present

## 2017-06-06 DIAGNOSIS — Z7982 Long term (current) use of aspirin: Secondary | ICD-10-CM

## 2017-06-06 DIAGNOSIS — I10 Essential (primary) hypertension: Secondary | ICD-10-CM | POA: Diagnosis present

## 2017-06-06 DIAGNOSIS — Z8619 Personal history of other infectious and parasitic diseases: Secondary | ICD-10-CM

## 2017-06-06 DIAGNOSIS — E785 Hyperlipidemia, unspecified: Secondary | ICD-10-CM | POA: Diagnosis present

## 2017-06-06 DIAGNOSIS — G4733 Obstructive sleep apnea (adult) (pediatric): Secondary | ICD-10-CM | POA: Diagnosis present

## 2017-06-06 DIAGNOSIS — Z96649 Presence of unspecified artificial hip joint: Secondary | ICD-10-CM

## 2017-06-06 HISTORY — PX: TOTAL HIP ARTHROPLASTY: SHX124

## 2017-06-06 LAB — TYPE AND SCREEN
ABO/RH(D): A POS
Antibody Screen: NEGATIVE

## 2017-06-06 SURGERY — ARTHROPLASTY, HIP, TOTAL, ANTERIOR APPROACH
Anesthesia: Spinal | Site: Hip | Laterality: Left

## 2017-06-06 MED ORDER — MORPHINE SULFATE (PF) 4 MG/ML IV SOLN: 1.0000 mg | INTRAVENOUS | Status: DC | PRN

## 2017-06-06 MED ORDER — SODIUM CHLORIDE 0.9 % IR SOLN
Status: DC | PRN
  Administered 2017-06-06: 1000 mL

## 2017-06-06 MED ORDER — DEXAMETHASONE SODIUM PHOSPHATE 10 MG/ML IJ SOLN
10.0000 mg | Freq: Once | INTRAMUSCULAR | Status: AC
Start: 2017-06-06 — End: 2017-06-06
  Administered 2017-06-06: 10 mg via INTRAVENOUS

## 2017-06-06 MED ORDER — LACTATED RINGERS IV SOLN
INTRAVENOUS | Status: DC
  Administered 2017-06-06 (×3): via INTRAVENOUS

## 2017-06-06 MED ORDER — PROPOFOL 500 MG/50ML IV EMUL
INTRAVENOUS | Status: DC | PRN
Start: 2017-06-06 — End: 2017-06-06
  Administered 2017-06-06: 75 ug/kg/min via INTRAVENOUS

## 2017-06-06 MED ORDER — MIDAZOLAM HCL 2 MG/2ML IJ SOLN
INTRAMUSCULAR | Status: DC | PRN
  Administered 2017-06-06: 2 mg via INTRAVENOUS

## 2017-06-06 MED ORDER — DOCUSATE SODIUM 100 MG PO CAPS
100.0000 mg | ORAL_CAPSULE | Freq: Two times a day (BID) | ORAL | Status: DC
  Administered 2017-06-06 – 2017-06-08 (×4): 100 mg via ORAL
  Filled 2017-06-06 (×4): qty 1

## 2017-06-06 MED ORDER — ACETAMINOPHEN 10 MG/ML IV SOLN
1000.0000 mg | Freq: Once | INTRAVENOUS | Status: AC
  Administered 2017-06-06: 1000 mg via INTRAVENOUS

## 2017-06-06 MED ORDER — TRANEXAMIC ACID 1000 MG/10ML IV SOLN
1000.0000 mg | Freq: Once | INTRAVENOUS | Status: AC
  Administered 2017-06-06: 1000 mg via INTRAVENOUS
  Filled 2017-06-06: qty 1100

## 2017-06-06 MED ORDER — LIDOCAINE 2% (20 MG/ML) 5 ML SYRINGE
INTRAMUSCULAR | Status: DC | PRN
  Administered 2017-06-06: 40 mg via INTRAVENOUS

## 2017-06-06 MED ORDER — METOCLOPRAMIDE HCL 5 MG PO TABS: 5.0000 mg | ORAL_TABLET | Freq: Three times a day (TID) | ORAL | Status: DC | PRN

## 2017-06-06 MED ORDER — HYDROMORPHONE HCL-NACL 0.5-0.9 MG/ML-% IV SOSY
PREFILLED_SYRINGE | INTRAVENOUS | Status: AC
  Filled 2017-06-06: qty 2

## 2017-06-06 MED ORDER — ACETAMINOPHEN 500 MG PO TABS
1000.0000 mg | ORAL_TABLET | Freq: Four times a day (QID) | ORAL | Status: AC
  Administered 2017-06-06 – 2017-06-07 (×4): 1000 mg via ORAL
  Filled 2017-06-06 (×4): qty 2

## 2017-06-06 MED ORDER — FLEET ENEMA 7-19 GM/118ML RE ENEM: 1.0000 | ENEMA | Freq: Once | RECTAL | Status: DC | PRN

## 2017-06-06 MED ORDER — ALBUMIN HUMAN 5 % IV SOLN
INTRAVENOUS | Status: AC
  Filled 2017-06-06: qty 500

## 2017-06-06 MED ORDER — ONDANSETRON HCL 4 MG PO TABS: 4.0000 mg | ORAL_TABLET | Freq: Four times a day (QID) | ORAL | Status: DC | PRN

## 2017-06-06 MED ORDER — POLYETHYLENE GLYCOL 3350 17 G PO PACK: 17.0000 g | PACK | Freq: Every day | ORAL | Status: DC | PRN

## 2017-06-06 MED ORDER — STERILE WATER FOR IRRIGATION IR SOLN
Status: DC | PRN
  Administered 2017-06-06: 3000 mL

## 2017-06-06 MED ORDER — SIMVASTATIN 20 MG PO TABS
40.0000 mg | ORAL_TABLET | Freq: Every morning | ORAL | Status: DC
  Administered 2017-06-07 – 2017-06-08 (×2): 40 mg via ORAL
  Filled 2017-06-06 (×2): qty 2

## 2017-06-06 MED ORDER — ONDANSETRON HCL 4 MG/2ML IJ SOLN
INTRAMUSCULAR | Status: DC | PRN
Start: 2017-06-06 — End: 2017-06-06
  Administered 2017-06-06: 4 mg via INTRAVENOUS

## 2017-06-06 MED ORDER — PROPOFOL 10 MG/ML IV BOLUS
INTRAVENOUS | Status: DC | PRN
  Administered 2017-06-06 (×2): 20 mg via INTRAVENOUS
  Administered 2017-06-06: 10 mg via INTRAVENOUS
  Administered 2017-06-06 (×4): 20 mg via INTRAVENOUS

## 2017-06-06 MED ORDER — VANCOMYCIN HCL IN DEXTROSE 1-5 GM/200ML-% IV SOLN
INTRAVENOUS | Status: AC
  Administered 2017-06-06: 1000 mg via INTRAVENOUS
  Filled 2017-06-06: qty 200

## 2017-06-06 MED ORDER — PROMETHAZINE HCL 25 MG/ML IJ SOLN: 6.2500 mg | INTRAMUSCULAR | Status: DC | PRN

## 2017-06-06 MED ORDER — RIVAROXABAN 10 MG PO TABS
10.0000 mg | ORAL_TABLET | Freq: Every day | ORAL | Status: DC
  Administered 2017-06-07 – 2017-06-08 (×2): 10 mg via ORAL
  Filled 2017-06-06 (×2): qty 1

## 2017-06-06 MED ORDER — PROPOFOL 10 MG/ML IV BOLUS
INTRAVENOUS | Status: AC
  Filled 2017-06-06: qty 40

## 2017-06-06 MED ORDER — METHOCARBAMOL 500 MG PO TABS
500.0000 mg | ORAL_TABLET | Freq: Four times a day (QID) | ORAL | Status: DC | PRN
Start: 2017-06-06 — End: 2017-06-08
  Filled 2017-06-06: qty 1

## 2017-06-06 MED ORDER — PROPOFOL 10 MG/ML IV BOLUS
INTRAVENOUS | Status: AC
  Filled 2017-06-06: qty 20

## 2017-06-06 MED ORDER — MIDAZOLAM HCL 2 MG/2ML IJ SOLN
INTRAMUSCULAR | Status: AC
  Filled 2017-06-06: qty 2

## 2017-06-06 MED ORDER — OXYCODONE HCL 5 MG PO TABS
5.0000 mg | ORAL_TABLET | ORAL | Status: DC | PRN
  Administered 2017-06-06: 5 mg via ORAL
  Administered 2017-06-06: 10 mg via ORAL
  Administered 2017-06-06: 16:00:00 5 mg via ORAL
  Administered 2017-06-07: 10 mg via ORAL
  Administered 2017-06-07: 5 mg via ORAL
  Administered 2017-06-08 (×2): 10 mg via ORAL
  Filled 2017-06-06 (×2): qty 1
  Filled 2017-06-06 (×5): qty 2
  Filled 2017-06-06: qty 1

## 2017-06-06 MED ORDER — MENTHOL 3 MG MT LOZG: 1.0000 | LOZENGE | OROMUCOSAL | Status: DC | PRN

## 2017-06-06 MED ORDER — LIDOCAINE HCL (PF) 2 % IJ SOLN
INTRAMUSCULAR | Status: AC
  Filled 2017-06-06: qty 10

## 2017-06-06 MED ORDER — FENTANYL CITRATE (PF) 250 MCG/5ML IJ SOLN
INTRAMUSCULAR | Status: AC
  Filled 2017-06-06: qty 5

## 2017-06-06 MED ORDER — SODIUM CHLORIDE 0.9 % IV SOLN
1000.0000 mg | INTRAVENOUS | Status: AC
  Administered 2017-06-06: 1000 mg via INTRAVENOUS
  Filled 2017-06-06: qty 1100

## 2017-06-06 MED ORDER — PHENYLEPHRINE HCL 10 MG/ML IJ SOLN
INTRAMUSCULAR | Status: DC | PRN
  Administered 2017-06-06: 40 ug/min via INTRAVENOUS

## 2017-06-06 MED ORDER — ALBUMIN HUMAN 5 % IV SOLN
INTRAVENOUS | Status: DC | PRN
  Administered 2017-06-06 (×2): via INTRAVENOUS

## 2017-06-06 MED ORDER — HYDROMORPHONE HCL-NACL 0.5-0.9 MG/ML-% IV SOSY
0.2500 mg | PREFILLED_SYRINGE | INTRAVENOUS | Status: DC | PRN
  Administered 2017-06-06 (×4): 0.5 mg via INTRAVENOUS

## 2017-06-06 MED ORDER — ACETAMINOPHEN 325 MG PO TABS: 650.0000 mg | ORAL_TABLET | Freq: Four times a day (QID) | ORAL | Status: DC | PRN

## 2017-06-06 MED ORDER — VANCOMYCIN HCL IN DEXTROSE 1-5 GM/200ML-% IV SOLN
1000.0000 mg | INTRAVENOUS | Status: AC
  Administered 2017-06-06: 1000 mg via INTRAVENOUS

## 2017-06-06 MED ORDER — ACETAMINOPHEN 650 MG RE SUPP: 650.0000 mg | Freq: Four times a day (QID) | RECTAL | Status: DC | PRN

## 2017-06-06 MED ORDER — BISACODYL 10 MG RE SUPP: 10.0000 mg | Freq: Every day | RECTAL | Status: DC | PRN

## 2017-06-06 MED ORDER — CHLORHEXIDINE GLUCONATE 4 % EX LIQD: 60.0000 mL | Freq: Once | CUTANEOUS | Status: DC

## 2017-06-06 MED ORDER — PHENOL 1.4 % MT LIQD: 1.0000 | OROMUCOSAL | Status: DC | PRN

## 2017-06-06 MED ORDER — ACETAMINOPHEN 10 MG/ML IV SOLN
INTRAVENOUS | Status: AC
  Filled 2017-06-06: qty 100

## 2017-06-06 MED ORDER — DEXAMETHASONE SODIUM PHOSPHATE 10 MG/ML IJ SOLN
10.0000 mg | Freq: Once | INTRAMUSCULAR | Status: AC
  Administered 2017-06-07: 10 mg via INTRAVENOUS
  Filled 2017-06-06: qty 1

## 2017-06-06 MED ORDER — BUPIVACAINE HCL (PF) 0.25 % IJ SOLN
INTRAMUSCULAR | Status: DC | PRN
Start: 2017-06-06 — End: 2017-06-06
  Administered 2017-06-06: 30 mL

## 2017-06-06 MED ORDER — VANCOMYCIN HCL IN DEXTROSE 1-5 GM/200ML-% IV SOLN
1000.0000 mg | Freq: Two times a day (BID) | INTRAVENOUS | Status: AC
  Administered 2017-06-06: 23:00:00 1000 mg via INTRAVENOUS
  Filled 2017-06-06: qty 200

## 2017-06-06 MED ORDER — ONDANSETRON HCL 4 MG/2ML IJ SOLN: 4.0000 mg | Freq: Four times a day (QID) | INTRAMUSCULAR | Status: DC | PRN

## 2017-06-06 MED ORDER — DIPHENHYDRAMINE HCL 12.5 MG/5ML PO ELIX: 12.5000 mg | ORAL_SOLUTION | ORAL | Status: DC | PRN

## 2017-06-06 MED ORDER — ALLOPURINOL 300 MG PO TABS
300.0000 mg | ORAL_TABLET | ORAL | Status: DC
  Administered 2017-06-07 – 2017-06-08 (×2): 300 mg via ORAL
  Filled 2017-06-06 (×2): qty 1

## 2017-06-06 MED ORDER — FENTANYL CITRATE (PF) 250 MCG/5ML IJ SOLN
INTRAMUSCULAR | Status: DC | PRN
  Administered 2017-06-06 (×2): 50 ug via INTRAVENOUS

## 2017-06-06 MED ORDER — METHOCARBAMOL 1000 MG/10ML IJ SOLN
500.0000 mg | Freq: Four times a day (QID) | INTRAVENOUS | Status: DC | PRN
  Administered 2017-06-06: 500 mg via INTRAVENOUS
  Filled 2017-06-06: qty 550

## 2017-06-06 MED ORDER — SODIUM CHLORIDE 0.9 % IV SOLN
INTRAVENOUS | Status: DC
  Administered 2017-06-06: 16:00:00 100 mL/h via INTRAVENOUS
  Administered 2017-06-06: 23:00:00 via INTRAVENOUS

## 2017-06-06 MED ORDER — METOCLOPRAMIDE HCL 5 MG/ML IJ SOLN: 5.0000 mg | Freq: Three times a day (TID) | INTRAMUSCULAR | Status: DC | PRN

## 2017-06-06 MED ORDER — TRAMADOL HCL 50 MG PO TABS: 50.0000 mg | ORAL_TABLET | Freq: Four times a day (QID) | ORAL | Status: DC | PRN

## 2017-06-06 MED ORDER — PHENYLEPHRINE 40 MCG/ML (10ML) SYRINGE FOR IV PUSH (FOR BLOOD PRESSURE SUPPORT)
PREFILLED_SYRINGE | INTRAVENOUS | Status: DC | PRN
  Administered 2017-06-06: 120 ug via INTRAVENOUS
  Administered 2017-06-06: 160 ug via INTRAVENOUS
  Administered 2017-06-06: 120 ug via INTRAVENOUS

## 2017-06-06 SURGICAL SUPPLY — 37 items
BAG DECANTER FOR FLEXI CONT (MISCELLANEOUS) ×3 IMPLANT
BAG ZIPLOCK 12X15 (MISCELLANEOUS) IMPLANT
BLADE SAG 18X100X1.27 (BLADE) ×3 IMPLANT
CAPT HIP TOTAL 2 ×3 IMPLANT
CLOSURE WOUND 1/2 X4 (GAUZE/BANDAGES/DRESSINGS) ×1
CLOTH BEACON ORANGE TIMEOUT ST (SAFETY) ×3 IMPLANT
COVER PERINEAL POST (MISCELLANEOUS) ×3 IMPLANT
COVER SURGICAL LIGHT HANDLE (MISCELLANEOUS) ×3 IMPLANT
CUP ACETBLR 52 OD PINNACLE (Hips) ×3 IMPLANT
DECANTER SPIKE VIAL GLASS SM (MISCELLANEOUS) ×3 IMPLANT
DRAPE STERI IOBAN 125X83 (DRAPES) ×3 IMPLANT
DRAPE U-SHAPE 47X51 STRL (DRAPES) ×6 IMPLANT
DRSG ADAPTIC 3X8 NADH LF (GAUZE/BANDAGES/DRESSINGS) ×3 IMPLANT
DRSG MEPILEX BORDER 4X4 (GAUZE/BANDAGES/DRESSINGS) ×3 IMPLANT
DRSG MEPILEX BORDER 4X8 (GAUZE/BANDAGES/DRESSINGS) ×3 IMPLANT
DURAPREP 26ML APPLICATOR (WOUND CARE) ×3 IMPLANT
ELECT REM PT RETURN 15FT ADLT (MISCELLANEOUS) ×3 IMPLANT
EVACUATOR 1/8 PVC DRAIN (DRAIN) ×3 IMPLANT
GLOVE BIO SURGEON STRL SZ7.5 (GLOVE) ×3 IMPLANT
GLOVE BIO SURGEON STRL SZ8 (GLOVE) ×6 IMPLANT
GLOVE BIOGEL PI IND STRL 7.5 (GLOVE) ×6 IMPLANT
GLOVE BIOGEL PI IND STRL 8 (GLOVE) ×2 IMPLANT
GLOVE BIOGEL PI INDICATOR 7.5 (GLOVE) ×12
GLOVE BIOGEL PI INDICATOR 8 (GLOVE) ×4
GOWN STRL REUS W/TWL LRG LVL3 (GOWN DISPOSABLE) ×3 IMPLANT
GOWN STRL REUS W/TWL XL LVL3 (GOWN DISPOSABLE) ×3 IMPLANT
PACK ANTERIOR HIP CUSTOM (KITS) ×3 IMPLANT
STRIP CLOSURE SKIN 1/2X4 (GAUZE/BANDAGES/DRESSINGS) ×2 IMPLANT
SUT ETHIBOND NAB CT1 #1 30IN (SUTURE) ×3 IMPLANT
SUT MNCRL AB 4-0 PS2 18 (SUTURE) ×3 IMPLANT
SUT STRATAFIX 0 PDS 27 VIOLET (SUTURE) ×3
SUT VIC AB 2-0 CT1 27 (SUTURE) ×4
SUT VIC AB 2-0 CT1 TAPERPNT 27 (SUTURE) ×2 IMPLANT
SUTURE STRATFX 0 PDS 27 VIOLET (SUTURE) ×1 IMPLANT
SYR 50ML LL SCALE MARK (SYRINGE) IMPLANT
TRAY FOLEY W/METER SILVER 16FR (SET/KITS/TRAYS/PACK) ×3 IMPLANT
YANKAUER SUCT BULB TIP 10FT TU (MISCELLANEOUS) ×3 IMPLANT

## 2017-06-06 NOTE — Anesthesia Postprocedure Evaluation (Signed)
Anesthesia Post Note  Patient: Thomas Barnes  Procedure(s) Performed: LEFT TOTAL HIP ARTHROPLASTY ANTERIOR APPROACH (Left Hip)     Patient location during evaluation: PACU Anesthesia Type: Spinal Level of consciousness: awake and alert Pain management: pain level controlled Vital Signs Assessment: post-procedure vital signs reviewed and stable Respiratory status: spontaneous breathing and respiratory function stable Cardiovascular status: blood pressure returned to baseline and stable Postop Assessment: spinal receding Anesthetic complications: no    Last Vitals:  Vitals:   06/06/17 1430 06/06/17 1445  BP: 101/75 100/65  Pulse: (!) 58 66  Resp: 12 (!) 9  Temp:    SpO2: 94% 97%    Last Pain:  Vitals:   06/06/17 1415  TempSrc:   PainSc: Thomas Barnes

## 2017-06-06 NOTE — Anesthesia Preprocedure Evaluation (Addendum)
Anesthesia Evaluation  Patient identified by MRN, date of birth, ID band Patient awake    Reviewed: Allergy & Precautions, NPO status , Patient's Chart, lab work & pertinent test results  Airway Mallampati: II  TM Distance: >3 FB Neck ROM: Full    Dental  (+) Dental Advisory Given   Pulmonary sleep apnea ,    breath sounds clear to auscultation       Cardiovascular hypertension, Pt. on medications (-) angina Rhythm:Regular Rate:Normal     Neuro/Psych negative neurological ROS     GI/Hepatic negative GI ROS, Neg liver ROS,   Endo/Other  negative endocrine ROS  Renal/GU negative Renal ROS     Musculoskeletal  (+) Arthritis ,   Abdominal   Peds  Hematology negative hematology ROS (+)   Anesthesia Other Findings   Reproductive/Obstetrics                            Lab Results  Component Value Date   WBC 5.8 05/30/2017   HGB 14.1 05/30/2017   HCT 40.2 05/30/2017   MCV 81.0 05/30/2017   PLT 279 05/30/2017   Lab Results  Component Value Date   INR 0.99 05/30/2017   Lab Results  Component Value Date   CREATININE 0.97 05/30/2017   BUN 15 05/30/2017   NA 138 05/30/2017   K 4.5 05/30/2017   CL 103 05/30/2017   CO2 24 05/30/2017    Anesthesia Physical Anesthesia Plan  ASA: II  Anesthesia Plan: Spinal   Post-op Pain Management:    Induction:   PONV Risk Score and Plan: 2 and Ondansetron, Dexamethasone and Treatment may vary due to age or medical condition  Airway Management Planned: Natural Airway and Simple Face Mask  Additional Equipment:   Intra-op Plan:   Post-operative Plan:   Informed Consent: I have reviewed the patients History and Physical, chart, labs and discussed the procedure including the risks, benefits and alternatives for the proposed anesthesia with the patient or authorized representative who has indicated his/her understanding and acceptance.      Plan Discussed with: CRNA  Anesthesia Plan Comments:         Anesthesia Quick Evaluation

## 2017-06-06 NOTE — Anesthesia Procedure Notes (Addendum)
Spinal  Patient location during procedure: OR Start time: 06/06/2017 10:57 AM End time: 06/06/2017 11:02 AM Staffing Anesthesiologist: Suzette Battiest Performed: anesthesiologist  Preanesthetic Checklist Completed: patient identified, site marked, surgical consent, pre-op evaluation, timeout performed, IV checked, risks and benefits discussed and monitors and equipment checked Spinal Block Patient position: sitting Prep: ChloraPrep and site prepped and draped Patient monitoring: blood pressure, continuous pulse ox and heart rate Approach: midline Location: L4-5 Injection technique: single-shot Needle Needle type: Pencan  Needle gauge: 24 G Needle length: 9 cm Needle insertion depth: 7 cm

## 2017-06-06 NOTE — Interval H&P Note (Signed)
History and Physical Interval Note:  06/06/2017 9:42 AM  Thomas Barnes  has presented today for surgery, with the diagnosis of Osteoarthritis Left Hip  The various methods of treatment have been discussed with the patient and family. After consideration of risks, benefits and other options for treatment, the patient has consented to  Procedure(s): LEFT TOTAL HIP ARTHROPLASTY ANTERIOR APPROACH (Left) as a surgical intervention .  The patient's history has been reviewed, patient examined, no change in status, stable for surgery.  I have reviewed the patient's chart and labs.  Questions were answered to the patient's satisfaction.     Gearlean Alf

## 2017-06-06 NOTE — Op Note (Signed)
OPERATIVE REPORT- TOTAL HIP ARTHROPLASTY   PREOPERATIVE DIAGNOSIS: Osteoarthritis of the Left hip.   POSTOPERATIVE DIAGNOSIS: Osteoarthritis of the Left  hip.   PROCEDURE: Left total hip arthroplasty, anterior approach.   SURGEON: Gaynelle Arabian, MD   ASSISTANT: Arlee Muslim, PA-C  ANESTHESIA:  Spinal  ESTIMATED BLOOD LOSS:-800 mL    DRAINS: Hemovac x1.   COMPLICATIONS: None   CONDITION: PACU - hemodynamically stable.   BRIEF CLINICAL NOTE: Thomas Barnes is a 71 y.o. male who has advanced end-  stage arthritis of their Left  hip with progressively worsening pain and  dysfunction.The patient has failed nonoperative management and presents for  total hip arthroplasty.   PROCEDURE IN DETAIL: After successful administration of spinal  anesthetic, the traction boots for the [facility] bed were placed on both  feet and the patient was placed onto the [facility] bed, boots placed into the leg  holders. The Left hip was then isolated from the perineum with plastic  drapes and prepped and draped in the usual sterile fashion. ASIS and  greater trochanter were marked and a oblique incision was made, starting  at about 1 cm lateral and 2 cm distal to the ASIS and coursing towards  the anterior cortex of the femur. The skin was cut with a 10 blade  through subcutaneous tissue to the level of the fascia overlying the  tensor fascia lata muscle. The fascia was then incised in line with the  incision at the junction of the anterior third and posterior 2/3rd. The  muscle was teased off the fascia and then the interval between the TFL  and the rectus was developed. The Hohmann retractor was then placed at  the top of the femoral neck over the capsule. The vessels overlying the  capsule were cauterized and the fat on top of the capsule was removed.  A Hohmann retractor was then placed anterior underneath the rectus  femoris to give exposure to the entire anterior capsule. A T-shaped   capsulotomy was performed. The edges were tagged and the femoral head  was identified.       Osteophytes are removed off the superior acetabulum.  The femoral neck was then cut in situ with an oscillating saw. Traction  was then applied to the left lower extremity utilizing the [facility]  traction. The femoral head was then removed. Retractors were placed  around the acetabulum and then circumferential removal of the labrum was  performed. Osteophytes were also removed. Reaming starts at 47 mm to  medialize and  Increased in 2 mm increments to 51 mm. We reamed in  approximately 40 degrees of abduction, 20 degrees anteversion. A 52 mm  pinnacle acetabular shell was then impacted in anatomic position under  fluoroscopic guidance with excellent purchase. We did not need to place  any additional dome screws. A 32 mm neutral + 4 marathon liner was then  placed into the acetabular shell.       The femoral lift was then placed along the lateral aspect of the femur  just distal to the vastus ridge. The leg was  externally rotated and capsule  was stripped off the inferior aspect of the femoral neck down to the  level of the lesser trochanter, this was done with electrocautery. The femur was lifted after this was performed. The  leg was then placed in an extended and adducted position essentially delivering the femur. We also removed the capsule superiorly and the piriformis from the piriformis  fossa to gain excellent exposure of the  proximal femur. Rongeur was used to remove some cancellous bone to get  into the lateral portion of the proximal femur for placement of the  initial starter reamer. The starter broaches was placed  the starter broach  and was shown to go down the center of the canal. Broaching  with the  Corail system was then performed starting at size 8, coursing  Up to size 16. A size 16 had excellent torsional and rotational  and axial stability. The trial high offset neck was then  placed  with a 32 + 5 trial head. The hip was then reduced. We confirmed that  the stem was in the canal both on AP and lateral x-rays. It also has excellent sizing. The hip was reduced with outstanding stability through full extension and full external rotation.. AP pelvis was taken and the leg lengths were measured and found to be equal. Hip was then dislocated again and the femoral head and neck removed. The  femoral broach was removed. Size 16 Cor[place] stem with a high offset  neck was then impacted into the femur following native anteversion. Has  excellent purchase in the canal. Excellent torsional and rotational and  axial stability. It is confirmed to be in the canal on AP and lateral  fluoroscopic views. The 32 + 5 ceramic head was placed and the hip  reduced with outstanding stability. Again AP pelvis was taken and it  confirmed that the leg lengths were equal. The wound was then copiously  irrigated with saline solution and the capsule reattached and repaired  with Ethibond suture. 30 ml of .25% Bupivicaine was  injected into the capsule and into the edge of the tensor fascia lata as well as subcutaneous tissue. The fascia overlying the tensor fascia lata was then closed with a running #1 V-Loc. Subcu was closed with interrupted 2-0 Vicryl and subcuticular running 4-0 Monocryl. Incision was cleaned  and dried. Steri-Strips and a bulky sterile dressing applied. Hemovac  drain was hooked to suction and then the patient was awakened and transported to  recovery in stable condition.        Please note that a surgical assistant was a medical necessity for this procedure to perform it in a safe and expeditious manner. Assistant was necessary to provide appropriate retraction of vital neurovascular structures and to prevent femoral fracture and allow for anatomic placement of the prosthesis.  Gaynelle Arabian, M.D.

## 2017-06-06 NOTE — Discharge Instructions (Addendum)
° °Dr. Frank [name] °Total Joint Specialist °[facility] °[address] °[place], [place] [address] °[phone] ° °ANTERIOR APPROACH TOTAL HIP REPLACEMENT POSTOPERATIVE DIRECTIONS ° ° °Hip Rehabilitation, Guidelines Following Surgery  °The results of a hip operation are greatly improved after range of motion and muscle strengthening exercises. Follow all safety measures which are given to protect your hip. If any of these exercises cause increased pain or swelling in your joint, decrease the amount until you are comfortable again. Then slowly increase the exercises. Call your caregiver if you have problems or questions.  ° °HOME CARE INSTRUCTIONS  °Remove items at home which could result in a fall. This includes throw rugs or furniture in walking pathways.  °· ICE to the affected hip every three hours for 30 minutes at a time and then as needed for pain and swelling.  Continue to use ice on the hip for pain and swelling from surgery. You may notice swelling that will progress down to the foot and ankle.  This is normal after surgery.  Elevate the leg when you are not up walking on it.   °· Continue to use the breathing machine which will help keep your temperature down.  It is common for your temperature to cycle up and down following surgery, especially at night when you are not up moving around and exerting yourself.  The breathing machine keeps your lungs expanded and your temperature down. ° ° °DIET °You may resume your previous home diet once your are discharged from the hospital. ° °DRESSING / WOUND CARE / SHOWERING °You may shower 3 days after surgery, but keep the wounds dry during showering.  You may use an occlusive plastic wrap (Press'n Seal for example), NO SOAKING/SUBMERGING IN THE BATHTUB.  If the bandage gets wet, change with a clean dry gauze.  If the incision gets wet, pat the wound dry with a clean towel. °You may start showering once you are discharged home but do not  submerge the incision under water. Just pat the incision dry and apply a dry gauze dressing on daily. °Change the surgical dressing daily and reapply a dry dressing each time. ° °ACTIVITY °Walk with your walker as instructed. °Use walker as long as suggested by your caregivers. °Avoid periods of inactivity such as sitting longer than an hour when not asleep. This helps prevent blood clots.  °You may resume a sexual relationship in one month or when given the OK by your doctor.  °You may return to work once you are cleared by your doctor.  °Do not drive a car for 6 weeks or until released by you surgeon.  °Do not drive while taking narcotics. ° °WEIGHT BEARING °Weight bearing as tolerated with assist device (walker, cane, etc) as directed, use it as long as suggested by your surgeon or therapist, typically at least 4-6 weeks. ° °POSTOPERATIVE CONSTIPATION PROTOCOL °Constipation - defined medically as fewer than three stools per week and severe constipation as less than one stool per week. ° °One of the most common issues patients have following surgery is constipation.  Even if you have a regular bowel pattern at home, your normal regimen is likely to be disrupted due to multiple reasons following surgery.  Combination of anesthesia, postoperative narcotics, change in appetite and fluid intake all can affect your bowels.  In order to avoid complications following surgery, here are some recommendations in order to help you during your recovery period. ° °Colace (docusate) - Pick up an over-the-counter   form of Colace or another stool softener and take twice a day as long as you are requiring postoperative pain medications.  Take with a full glass of water daily.  If you experience loose stools or diarrhea, hold the colace until you stool forms back up.  If your symptoms do not get better within 1 week or if they get worse, check with your doctor. ° °Dulcolax (bisacodyl) - Pick up over-the-counter and take as directed  by the product packaging as needed to assist with the movement of your bowels.  Take with a full glass of water.  Use this product as needed if not relieved by Colace only.  ° °MiraLax (polyethylene glycol) - Pick up over-the-counter to have on hand.  MiraLax is a solution that will increase the amount of water in your bowels to assist with bowel movements.  Take as directed and can mix with a glass of water, juice, soda, coffee, or tea.  Take if you go more than two days without a movement. °Do not use MiraLax more than once per day. Call your doctor if you are still constipated or irregular after using this medication for 7 days in a row. ° °If you continue to have problems with postoperative constipation, please contact the office for further assistance and recommendations.  If you experience "the worst abdominal pain ever" or develop nausea or vomiting, please contact the office immediatly for further recommendations for treatment. ° °ITCHING ° If you experience itching with your medications, try taking only a single pain pill, or even half a pain pill at a time.  You can also use Benadryl over the counter for itching or also to help with sleep.  ° °TED HOSE STOCKINGS °Wear the elastic stockings on both legs for three weeks following surgery during the day but you may remove then at night for sleeping. ° °MEDICATIONS °See your medication summary on the “After Visit Summary” that the nursing staff will review with you prior to discharge.  You may have some home medications which will be placed on hold until you complete the course of blood thinner medication.  It is important for you to complete the blood thinner medication as prescribed by your surgeon.  Continue your approved medications as instructed at time of discharge. ° °PRECAUTIONS °If you experience chest pain or shortness of breath - call 911 immediately for transfer to the hospital emergency department.  °If you develop a fever greater that 101 F,  purulent drainage from wound, increased redness or drainage from wound, foul odor from the wound/dressing, or calf pain - CONTACT YOUR SURGEON.   °                                                °FOLLOW-UP APPOINTMENTS °Make sure you keep all of your appointments after your operation with your surgeon and caregivers. You should call the office at the above phone number and make an appointment for approximately two weeks after the date of your surgery or on the date instructed by your surgeon outlined in the "After Visit Summary". ° °RANGE OF MOTION AND STRENGTHENING EXERCISES  °These exercises are designed to help you keep full movement of your hip joint. Follow your caregiver's or physical therapist's instructions. Perform all exercises about fifteen times, three times per day or as directed. Exercise both hips, even if you   have had only one joint replacement. These exercises can be done on a training (exercise) mat, on the floor, on a table or on a bed. Use whatever works the best and is most comfortable for you. Use music or television while you are exercising so that the exercises are a pleasant break in your day. This will make your life better with the exercises acting as a break in routine you can look forward to.  Lying on your back, slowly slide your foot toward your buttocks, raising your knee up off the floor. Then slowly slide your foot back down until your leg is straight again.  Lying on your back spread your legs as far apart as you can without causing discomfort.  Lying on your side, raise your upper leg and foot straight up from the floor as far as is comfortable. Slowly lower the leg and repeat.  Lying on your back, tighten up the muscle in the front of your thigh (quadriceps muscles). You can do this by keeping your leg straight and trying to raise your heel off the floor. This helps strengthen the largest muscle supporting your knee.  Lying on your back, tighten up the muscles of your  buttocks both with the legs straight and with the knee bent at a comfortable angle while keeping your heel on the floor.   IF YOU ARE TRANSFERRED TO A SKILLED REHAB FACILITY If the patient is transferred to a skilled rehab facility following release from the hospital, a list of the current medications will be sent to the facility for the patient to continue.  When discharged from the skilled rehab facility, please have the facility set up the patient's [place] prior to being released. Also, the skilled facility will be responsible for providing the patient with their medications at time of release from the facility to include their pain medication, the muscle relaxants, and their blood thinner medication. If the patient is still at the rehab facility at time of the two week follow up appointment, the skilled rehab facility will also need to assist the patient in arranging follow up appointment in our office and any transportation needs.  MAKE SURE YOU:  Understand these instructions.  Get help right away if you are not doing well or get worse.    Pick up stool softner and laxative for home use following surgery while on pain medications. Do not submerge incision under water. Please use good hand washing techniques while changing dressing each day. May shower starting three days after surgery. Please use a clean towel to pat the incision dry following showers. Continue to use ice for pain and swelling after surgery. Do not use any lotions or creams on the incision until instructed by your surgeon.  Take Xarelto for two and a half more weeks following discharge from the hospital, then discontinue Xarelto. Once the patient has completed the Xarelto, they may resume the 81 mg Aspirin.    Information on my medicine - XARELTO (Rivaroxaban)  Why was Xarelto prescribed for you? Xarelto was prescribed for you to reduce the risk of blood clots forming after orthopedic surgery.  The medical term for these abnormal blood clots is venous thromboembolism (VTE).  What do you need to know about xarelto ? Take your Xarelto ONCE DAILY at the same time every day. You may take it either with or without food.  If you have difficulty swallowing the tablet whole, you may crush it and mix in applesauce just prior  to taking your dose.  Take Xarelto exactly as prescribed by your doctor and DO NOT stop taking Xarelto without talking to the doctor who prescribed the medication.  Stopping without other VTE prevention medication to take the place of Xarelto may increase your risk of developing a clot.  After discharge, you should have regular check-up appointments with your healthcare provider that is prescribing your Xarelto.    What do you do if you miss a dose? If you miss a dose, take it as soon as you remember on the same day then continue your regularly scheduled once daily regimen the next day. Do not take two doses of Xarelto on the same day.   Important Safety Information A possible side effect of Xarelto is bleeding. You should call your healthcare provider right away if you experience any of the following: ? Bleeding from an injury or your nose that does not stop. ? Unusual colored urine (red or dark brown) or unusual colored stools (red or black). ? Unusual bruising for unknown reasons. ? A serious fall or if you hit your head (even if there is no bleeding).  Some medicines may interact with Xarelto and might increase your risk of bleeding while on Xarelto. To help avoid this, consult your healthcare provider or pharmacist prior to using any new prescription or non-prescription medications, including herbals, vitamins, non-steroidal anti-inflammatory drugs (NSAIDs) and supplements.  This website has more information on Xarelto: [email or web address]

## 2017-06-06 NOTE — Transfer of Care (Signed)
Immediate Anesthesia Transfer of Care Note  Patient: Thomas Barnes  Procedure(s) Performed: LEFT TOTAL HIP ARTHROPLASTY ANTERIOR APPROACH (Left Hip)  Patient Location: PACU  Anesthesia Type:Spinal  Level of Consciousness: awake and alert   Airway & Oxygen Therapy: Patient Spontanous Breathing and Patient connected to face mask oxygen  Post-op Assessment: Report given to RN and Post -op Vital signs reviewed and stable  Post vital signs: Reviewed and stable  Last Vitals:  Vitals:   06/06/17 0950  BP: 124/84  Pulse: 62  Resp: 16  Temp: 36.8 C  SpO2: 98%    Last Pain:  Vitals:   06/06/17 0950  TempSrc: Oral         Complications: No apparent anesthesia complications

## 2017-06-06 NOTE — H&P (View-Only) (Signed)
Thomas Barnes DOB: 03/01/1946 Married / Language: English / Race: White Male Date of Admission:  06/06/2017 CC: Left Hip Pain History of Present Illness The patient is a 71 year old male who comes in for a preoperative History and Physical. The patient is scheduled for a left total hip arthroplasty (anterior) to be performed by Dr. [name]. [name], MD at [facility] on 06-06-2017. The patient is being followed for their left hip pain and osteoarthritis. They are months out from IA injection with Dr.[name]. Symptoms reported include: pain, pain after sitting, catching, pain with weightbearing and difficulty ambulating. and report their pain level to be mild to moderate. The following medication has been used for pain control: Aleve. The patient has not gotten any relief of their symptoms with Cortisone injections. Mr. Thomas Barnes has been diagnosed with some osteoarthritis of the left hip in the past. He had an intra-articular injection in January by Dr. [name] which relieved his pain for about 3-4 months. He had a second injection this past May did not help as much. He has been having pain for about a year now. He denies any pain at rest or at night. There is no pain in the hip when he sitting in his recliner. He does have pain upon transferring to a standing position but it will ease off a little bit with some activity. He does have more pain if he has long periods of inactivity. Pain with weightbearing and standing and more recently it was quite severe prompting him to call him for this appointment today. He has been using some Aleve over-the-counter the past couple of days which has helped a little bit.  Radiographs-AP pelvis and lateral of the LEFT hip show that he has now progressed to bone-on-bone changes in the LEFT hip. The arthritis has progressed significantly since last December. He is having more functional problems now in the last injection did not help much. He and his wife  are both very concerned with the radiographic progression and also given that that he did not have a good response to the last injection, they opted to go ahead and proceed with total hip arthroplasty. They have been treated conservatively in the past for the above stated problem and despite conservative measures, they continue to have progressive pain and severe functional limitations and dysfunction. They have failed non-operative management including home exercise, medications, and injections. It is felt that they would benefit from undergoing total joint replacement. Risks and benefits of the procedure have been discussed with the patient and they elect to proceed with surgery. There are no active contraindications to surgery such as ongoing infection or rapidly progressive neurological disease.   Problem List/Past Medical  Primary osteoarthritis of left hip (M16.12)  Gout  High blood pressure  Hypercholesterolemia  Sleep Apnea  Elevated PSA  Measles  Mumps     Allergies  PENICILLIN [02/17/2011]: Hives.  Family History Cancer  Father, Mother. Heart Disease  Father. Hypertension  Father.  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 08/11/2016 Post-Surgical Plans  Home With Family.  Medication History Simvastatin (40MG  Tablet, Oral) Active. Lisinopril (20MG  Tablet, Oral) Active. Allopurinol (300MG  Tablet, Oral) Active. Aspirin (81MG  Tablet, 1 (  one) Oral) Active. Fish Oil Concentrate (435MG  Capsule, 1 (one) Oral) Active.  Past Surgical History Tonsillectomy   Review of Systems General Not Present- Chills, Fatigue, Fever, Memory Loss, Night Sweats, Weight  Gain and Weight Loss. Skin Not Present- Eczema, Hives, Itching, Lesions and Rash. HEENT Not Present- Dentures, Double Vision, Headache, Hearing Loss, Tinnitus and Visual Loss. Respiratory Not Present- Allergies, Chronic Cough, Coughing up blood, Shortness of breath at rest and Shortness of breath with exertion. Cardiovascular Not Present- Chest Pain, Difficulty Breathing Lying Down, Murmur, Palpitations, Racing/skipping heartbeats and Swelling. Gastrointestinal Not Present- Abdominal Pain, Bloody Stool, Constipation, Diarrhea, Difficulty Swallowing, Heartburn, Jaundice, Loss of appetitie, Nausea and Vomiting. Male Genitourinary Not Present- Blood in Urine, Discharge, Flank Pain, Incontinence, Painful Urination, Urgency, Urinary frequency, Urinary Retention, Urinating at Night and Weak urinary stream. Musculoskeletal Present- Joint Pain. Not Present- Back Pain, Joint Swelling, Morning Stiffness, Muscle Pain, Muscle Weakness and Spasms. Neurological Not Present- Blackout spells, Difficulty with balance, Dizziness, Paralysis, Tremor and Weakness. Psychiatric Not Present- Insomnia.  Vitals Weight: 212 lb Height: 70in Body Surface Area: 2.14 m Body Mass Index: 30.42 kg/m  Pulse: 60 (Regular)  BP: 122/62 (Sitting, Right Arm, Standard)       Physical Exam  General Mental Status -Alert, cooperative and good historian. General Appearance-pleasant, Not in acute distress. Orientation-Oriented X3. Build & Nutrition-Well nourished and Well developed.  Head and Neck Head-normocephalic, atraumatic . Neck Global Assessment - supple, no bruit auscultated on the right, no bruit auscultated on the left.  Eye Pupil - Bilateral-Regular and Round. Motion - Bilateral-EOMI.  Chest and Lung Exam Auscultation Breath sounds - clear at anterior chest wall and clear at posterior chest wall. Adventitious sounds - No Adventitious sounds.  Cardiovascular Auscultation Rhythm -  Regular rate and rhythm. Heart Sounds - S1 WNL and S2 WNL. Murmurs & Other Heart Sounds - Auscultation of the heart reveals - No Murmurs.  Abdomen Palpation/Percussion Tenderness - Abdomen is non-tender to palpation. Rigidity (guarding) - Abdomen is soft. Auscultation Auscultation of the abdomen reveals - Bowel sounds normal.  Male Genitourinary Note: Not done, not pertinent to present illness   Musculoskeletal Note: Examination of the right hip shows flexion to 120 rotation in 30 abduction 40 and external rotation of 40. There is no tenderness over the greater trochanter. There is no pain on provocative testing of the hip. His LEFT hip can be flexed to 100 rotate and 5 out 30 and abduct 30 with discomfort on range of motion. He does not have any trochanteric tenderness. He has an antalgic gait pattern on the LEFT.  Radiographs-AP pelvis and lateral of the LEFT hip show that he has now progressed to bone-on-bone changes in the LEFT hip. This is a substantial progression compared to his x-rays from last December.   Assessment & Plan Primary osteoarthritis of left hip (M16.12)  Note:Surgical Plans: Left Total Hip Replacement - Anterior Approach  Disposition: Home with HHPT  PCP: Dr. Virgina Jock  IV TXA  Anesthesia Issues: None  Patient was instructed on what medications to stop prior to surgery.  Signed electronically by Joelene Millin, III PA-C

## 2017-06-06 NOTE — Anesthesia Procedure Notes (Signed)
Date/Time: 06/06/2017 10:50 AM Performed by: Cynda Familia Pre-anesthesia Checklist: Patient being monitored, Suction available, Emergency Drugs available and Patient identified Patient Re-evaluated:Patient Re-evaluated prior to induction Oxygen Delivery Method: Simple face mask Placement Confirmation: breath sounds checked- equal and bilateral and positive ETCO2 Dental Injury: Teeth and Oropharynx as per pre-operative assessment  Comments: Mask O2 for spinal --- sedation

## 2017-06-07 LAB — CBC
HCT: 32.2 % — ABNORMAL LOW (ref 39.0–52.0)
Hemoglobin: 11.3 g/dL — ABNORMAL LOW (ref 13.0–17.0)
MCH: 28 pg (ref 26.0–34.0)
MCHC: 35.1 g/dL (ref 30.0–36.0)
MCV: 79.9 fL (ref 78.0–100.0)
Platelets: 243 10*3/uL (ref 150–400)
RBC: 4.03 MIL/uL — ABNORMAL LOW (ref 4.22–5.81)
RDW: 13.3 % (ref 11.5–15.5)
WBC: 10.5 10*3/uL (ref 4.0–10.5)

## 2017-06-07 LAB — BASIC METABOLIC PANEL
Anion gap: 8 (ref 5–15)
BUN: 13 mg/dL (ref 6–20)
CO2: 23 mmol/L (ref 22–32)
Calcium: 8.5 mg/dL — ABNORMAL LOW (ref 8.9–10.3)
Chloride: 105 mmol/L (ref 101–111)
Creatinine, Ser: 0.91 mg/dL (ref 0.61–1.24)
GFR calc Af Amer: >60 mL/min (ref >60)
GFR calc non Af Amer: >60 mL/min (ref >60)
Glucose, Bld: 187 mg/dL — ABNORMAL HIGH (ref 65–99)
Potassium: 4.2 mmol/L (ref 3.5–5.1)
Sodium: 136 mmol/L (ref 135–145)

## 2017-06-07 MED ORDER — TRAMADOL HCL 50 MG PO TABS: 50.0000 mg | ORAL_TABLET | Freq: Four times a day (QID) | ORAL | 0 refills | Status: DC | PRN

## 2017-06-07 MED ORDER — RIVAROXABAN 10 MG PO TABS
10.0000 mg | ORAL_TABLET | Freq: Every day | ORAL | 0 refills | Status: DC
Start: 2017-06-08 — End: 2017-06-07

## 2017-06-07 MED ORDER — OXYCODONE HCL 5 MG PO TABS: 5.0000 mg | ORAL_TABLET | ORAL | 0 refills | Status: DC | PRN

## 2017-06-07 MED ORDER — METHOCARBAMOL 500 MG PO TABS: 500.0000 mg | ORAL_TABLET | Freq: Four times a day (QID) | ORAL | 0 refills | Status: DC | PRN

## 2017-06-07 MED ORDER — RIVAROXABAN 10 MG PO TABS: 10.0000 mg | ORAL_TABLET | Freq: Every day | ORAL | 0 refills | Status: DC

## 2017-06-07 NOTE — Evaluation (Signed)
Physical Therapy Evaluation Patient Details Name: Thomas Barnes MRN: [id number] DOB: 1946/05/13 Today's Date: 06/07/2017   History of Present Illness  Pt s/p L THR  Clinical Impression  Pt s/p L THR and presents with decreased L LE strength/ROM and post op pain limiting functional mobility.  Pt should progress to dc home with family assist.    Follow Up Recommendations DC plan and follow up therapy as arranged by surgeon;Home health PT    Equipment Recommendations  None recommended by PT    Recommendations for Other Services       Precautions / Restrictions Precautions Precautions: Fall Restrictions Weight Bearing Restrictions: No Other Position/Activity Restrictions: WBAT      Mobility  Bed Mobility Overal bed mobility: Needs Assistance Bed Mobility: Supine to Sit     Supine to sit: Min assist     General bed mobility comments: cues for sequence and use of R LE to self assist  Transfers Overall transfer level: Needs assistance Equipment used: Rolling walker (2 wheeled) Transfers: Sit to/from Stand Sit to Stand: Min assist         General transfer comment: cues for LE management and use of UEs to self assist  Ambulation/Gait Ambulation/Gait assistance: Min assist;Min guard Ambulation Distance (Feet): 75 Feet Assistive device: Rolling walker (2 wheeled) Gait Pattern/deviations: Step-to pattern;Decreased step length - right;Decreased step length - left;Shuffle;Trunk flexed Gait velocity: decr Gait velocity interpretation: Below normal speed for age/gender General Gait Details: cues for posture, position from RW and initial sequence  Stairs            Wheelchair Mobility    Modified Rankin (Stroke Patients Only)       Balance                                             Pertinent Vitals/Pain Pain Assessment: 0-10 Pain Score: 4  Pain Location: L hip/thigh Pain Descriptors / Indicators: Aching;Burning;Sore Pain  Intervention(s): Limited activity within patient's tolerance;Monitored during session;Premedicated before session;Ice applied    Home Living Family/patient expects to be discharged to:: Private residence Living Arrangements: Spouse/significant other Available Help at Discharge: Family Type of Home: House Home Access: Stairs to enter Entrance Stairs-Rails: None Entrance Stairs-Number of Steps: 2+1 Home Layout: One level Home Equipment: Environmental consultant - 2 wheels;Bedside commode      Prior Function Level of Independence: Independent               Hand Dominance        Extremity/Trunk Assessment   Upper Extremity Assessment Upper Extremity Assessment: Overall WFL for tasks assessed    Lower Extremity Assessment Lower Extremity Assessment: LLE deficits/detail LLE Deficits / Details: Strength at hip 2/5 with AAROM at hip to 80 flex and 20 abd    Cervical / Trunk Assessment Cervical / Trunk Assessment: Normal  Communication   Communication: No difficulties  Cognition Arousal/Alertness: Awake/alert Behavior During Therapy: WFL for tasks assessed/performed Overall Cognitive Status: Within Functional Limits for tasks assessed                                        General Comments      Exercises Total Joint Exercises Ankle Circles/Pumps: AROM;Both;15 reps;Supine Quad Sets: AROM;Both;10 reps;Supine Heel Slides: AAROM;Left;20 reps;Supine Hip ABduction/ADduction: AAROM;Left;15 reps;Supine  Assessment/Plan    PT Assessment Patient needs continued PT services  PT Problem List Decreased strength;Decreased range of motion;Decreased activity tolerance;Decreased mobility;Decreased knowledge of use of DME;Pain       PT Treatment Interventions DME instruction;Gait training;Stair training;Functional mobility training;Therapeutic activities;Therapeutic exercise;Patient/family education    PT Goals (Current goals can be found in the Care Plan section)  Acute Rehab  PT Goals Patient Stated Goal: Regain IND PT Goal Formulation: With patient Time For Goal Achievement: 06/11/17 Potential to Achieve Goals: Good    Frequency 7X/week   Barriers to discharge        Co-evaluation               AM-PAC PT "6 Clicks" Daily Activity  Outcome Measure Difficulty turning over in bed (including adjusting bedclothes, sheets and blankets)?: Unable Difficulty moving from lying on back to sitting on the side of the bed? : Unable Difficulty sitting down on and standing up from a chair with arms (e.g., wheelchair, bedside commode, etc,.)?: Unable Help needed moving to and from a bed to chair (including a wheelchair)?: A Little Help needed walking in hospital room?: A Little Help needed climbing 3-5 steps with a railing? : A Little 6 Click Score: 12    End of Session Equipment Utilized During Treatment: Gait belt Activity Tolerance: Patient tolerated treatment well Patient left: in chair;with call bell/phone within reach;with family/visitor present Nurse Communication: Mobility status PT Visit Diagnosis: Difficulty in walking, not elsewhere classified (R26.2)    Time: 1914-7829 PT Time Calculation (min) (ACUTE ONLY): 35 min   Charges:   PT Evaluation $PT Eval Low Complexity: 1 Low PT Treatments $Therapeutic Exercise: 8-22 mins   PT G Codes:        Pg [phone]   Quaid Yeakle 06/07/2017, 11:45 AM

## 2017-06-07 NOTE — Progress Notes (Signed)
Discharge planning, spoke with patient and spouse at bedside. [facility] chosen [facility]. Contacted [facility] for referral. Has [place] and [address]. [phone]

## 2017-06-07 NOTE — Progress Notes (Signed)
Physical Therapy Treatment Patient Details Name: Thomas Barnes MRN: [id number] DOB: 02/04/1946 Today's Date: 06/07/2017    History of Present Illness Pt s/p L THR    PT Comments    Pt progressing steadily with mobility despite increased pain this pm.   Follow Up Recommendations  DC plan and follow up therapy as arranged by surgeon;Home health PT     Equipment Recommendations  None recommended by PT    Recommendations for Other Services       Precautions / Restrictions Precautions Precautions: Fall Restrictions Weight Bearing Restrictions: No Other Position/Activity Restrictions: WBAT    Mobility  Bed Mobility Overal bed mobility: Needs Assistance Bed Mobility: Sit to Supine       Sit to supine: Min assist   General bed mobility comments: cues for sequence and use of R LE to self assist  Transfers Overall transfer level: Needs assistance Equipment used: Rolling walker (2 wheeled) Transfers: Sit to/from Stand Sit to Stand: Min assist;Min guard         General transfer comment: cues for LE management and use of UEs to self assist  Ambulation/Gait Ambulation/Gait assistance: Min assist;Min guard Ambulation Distance (Feet): 150 Feet (and 20' into bathroom) Assistive device: Rolling walker (2 wheeled) Gait Pattern/deviations: Step-to pattern;Decreased step length - right;Decreased step length - left;Shuffle;Trunk flexed Gait velocity: decr Gait velocity interpretation: Below normal speed for age/gender General Gait Details: cues for posture, position from RW and initial sequence   Stairs            Wheelchair Mobility    Modified Rankin (Stroke Patients Only)       Balance                                            Cognition Arousal/Alertness: Awake/alert Behavior During Therapy: WFL for tasks assessed/performed Overall Cognitive Status: Within Functional Limits for tasks assessed                                         Exercises Total Joint Exercises Ankle Circles/Pumps: AROM;Both;15 reps;Supine Quad Sets: AROM;Both;10 reps;Supine Heel Slides: AAROM;Left;20 reps;Supine Hip ABduction/ADduction: AAROM;Left;15 reps;Supine    General Comments        Pertinent Vitals/Pain Pain Assessment: 0-10 Pain Score: 7  Pain Location: L hip/thigh Pain Descriptors / Indicators: Aching;Burning;Sore Pain Intervention(s): Limited activity within patient's tolerance;Monitored during session;Patient requesting pain meds-RN notified;RN gave pain meds during session;Ice applied    Home Living                      Prior Function            PT Goals (current goals can now be found in the care plan section) Acute Rehab PT Goals Patient Stated Goal: Regain IND PT Goal Formulation: With patient Time For Goal Achievement: 06/11/17 Potential to Achieve Goals: Good Progress towards PT goals: Progressing toward goals    Frequency    7X/week      PT Plan Current plan remains appropriate    Co-evaluation              AM-PAC PT "6 Clicks" Daily Activity  Outcome Measure  Difficulty turning over in bed (including adjusting bedclothes, sheets and blankets)?: Unable Difficulty moving from lying on back to  sitting on the side of the bed? : Unable Difficulty sitting down on and standing up from a chair with arms (e.g., wheelchair, bedside commode, etc,.)?: A Lot Help needed moving to and from a bed to chair (including a wheelchair)?: A Little Help needed walking in hospital room?: A Little Help needed climbing 3-5 steps with a railing? : A Little 6 Click Score: 13    End of Session Equipment Utilized During Treatment: Gait belt Activity Tolerance: Patient tolerated treatment well Patient left: in bed;with call bell/phone within reach;with family/visitor present Nurse Communication: Mobility status PT Visit Diagnosis: Difficulty in walking, not elsewhere classified (R26.2)      Time: 1610-9604 PT Time Calculation (min) (ACUTE ONLY): 49 min  Charges:  $Gait Training: 23-37 mins $Therapeutic Exercise: 8-22 mins                    G Codes:       Pg [phone]    Alvina Strother 06/07/2017, 3:37 PM

## 2017-06-07 NOTE — Discharge Summary (Signed)
Physician Discharge Summary   Patient ID: Thomas Barnes MRN: [id number] DOB/AGE: 70/20/1947 70 y.o.  Admit date: 06/06/2017 Discharge date: 06/08/17  Primary Diagnosis:  Osteoarthritis of the Left hip.   Admission Diagnoses:  Past Medical History:  Diagnosis Date  . Arthritis   . Elevated PSA   . Hyperlipidemia   . Hypertension   . OSA (obstructive sleep apnea)    per pt study done 2011 --  intolerent CPAP   Discharge Diagnoses:   Principal Problem:   OA (osteoarthritis) of hip  Estimated body mass index is 30.85 kg/m as calculated from the following:   Height as of this encounter: _0  (1.778 m).   Weight as of this encounter: 97.5 kg (215 lb).  Procedure(s) (LRB): LEFT TOTAL HIP ARTHROPLASTY ANTERIOR APPROACH (Left)   Consults: None  HPI: Thomas Barnes is a 71 y.o. male who has advanced end-  stage arthritis of their Left  hip with progressively worsening pain and  dysfunction.The patient has failed nonoperative management and presents for  total hip arthroplasty.  Laboratory Data: Admission on 06/06/2017  Component Date Value Ref Range Status  . WBC 06/07/2017 10.5  4.0 - 10.5 K/uL Final  . RBC 06/07/2017 4.03* 4.22 - 5.81 MIL/uL Final  . Hemoglobin 06/07/2017 11.3* 13.0 - 17.0 g/dL Final  . HCT 06/07/2017 32.2* 39.0 - 52.0 % Final  . MCV 06/07/2017 79.9  78.0 - 100.0 fL Final  . MCH 06/07/2017 28.0  26.0 - 34.0 pg Final  . MCHC 06/07/2017 35.1  30.0 - 36.0 g/dL Final  . RDW 06/07/2017 13.3  11.5 - 15.5 % Final  . Platelets 06/07/2017 243  150 - 400 K/uL Final  . Sodium 06/07/2017 136  135 - 145 mmol/L Final  . Potassium 06/07/2017 4.2  3.5 - 5.1 mmol/L Final  . Chloride 06/07/2017 105  101 - 111 mmol/L Final  . CO2 06/07/2017 23  22 - 32 mmol/L Final  . Glucose, Bld 06/07/2017 187* 65 - 99 mg/dL Final  . BUN 06/07/2017 13  6 - 20 mg/dL Final  . Creatinine, Ser 06/07/2017 0.91  0.61 - 1.24 mg/dL Final  . Calcium 06/07/2017 8.5* 8.9 - 10.3 mg/dL Final   . GFR calc non Af Amer 06/07/2017 >60  >60 mL/min Final  . GFR calc Af Amer 06/07/2017 >60  >60 mL/min Final   Comment: (NOTE) The eGFR has been calculated using the CKD EPI equation. This calculation has not been validated in all clinical situations. eGFR's persistently <60 mL/min signify possible Chronic Kidney Disease.   . Anion gap 06/07/2017 8  5 - 15 Final  . WBC 06/08/2017 12.2* 4.0 - 10.5 K/uL Final  . RBC 06/08/2017 3.98* 4.22 - 5.81 MIL/uL Final  . Hemoglobin 06/08/2017 11.2* 13.0 - 17.0 g/dL Final  . HCT 06/08/2017 32.5* 39.0 - 52.0 % Final  . MCV 06/08/2017 81.7  78.0 - 100.0 fL Final  . MCH 06/08/2017 28.1  26.0 - 34.0 pg Final  . MCHC 06/08/2017 34.5  30.0 - 36.0 g/dL Final  . RDW 06/08/2017 13.6  11.5 - 15.5 % Final  . Platelets 06/08/2017 263  150 - 400 K/uL Final  . Sodium 06/08/2017 137  135 - 145 mmol/L Final  . Potassium 06/08/2017 4.5  3.5 - 5.1 mmol/L Final  . Chloride 06/08/2017 104  101 - 111 mmol/L Final  . CO2 06/08/2017 26  22 - 32 mmol/L Final  . Glucose, Bld 06/08/2017 177* 65 - 99 mg/dL Final  .  BUN 06/08/2017 18  6 - 20 mg/dL Final  . Creatinine, Ser 06/08/2017 0.96  0.61 - 1.24 mg/dL Final  . Calcium 06/08/2017 8.9  8.9 - 10.3 mg/dL Final  . GFR calc non Af Amer 06/08/2017 >60  >60 mL/min Final  . GFR calc Af Amer 06/08/2017 >60  >60 mL/min Final   Comment: (NOTE) The eGFR has been calculated using the CKD EPI equation. This calculation has not been validated in all clinical situations. eGFR's persistently <60 mL/min signify possible Chronic Kidney Disease.   Georgiann Hahn gap 06/08/2017 7  5 - 15 Final  Hospital Outpatient Visit on 05/30/2017  Component Date Value Ref Range Status  . aPTT 05/30/2017 31  24 - 36 seconds Final  . WBC 05/30/2017 5.8  4.0 - 10.5 K/uL Final  . RBC 05/30/2017 4.96  4.22 - 5.81 MIL/uL Final  . Hemoglobin 05/30/2017 14.1  13.0 - 17.0 g/dL Final  . HCT 05/30/2017 40.2  39.0 - 52.0 % Final  . MCV 05/30/2017 81.0  78.0 -  100.0 fL Final  . MCH 05/30/2017 28.4  26.0 - 34.0 pg Final  . MCHC 05/30/2017 35.1  30.0 - 36.0 g/dL Final  . RDW 05/30/2017 13.7  11.5 - 15.5 % Final  . Platelets 05/30/2017 279  150 - 400 K/uL Final  . Sodium 05/30/2017 138  135 - 145 mmol/L Final  . Potassium 05/30/2017 4.5  3.5 - 5.1 mmol/L Final  . Chloride 05/30/2017 103  101 - 111 mmol/L Final  . CO2 05/30/2017 24  22 - 32 mmol/L Final  . Glucose, Bld 05/30/2017 136* 65 - 99 mg/dL Final  . BUN 05/30/2017 15  6 - 20 mg/dL Final  . Creatinine, Ser 05/30/2017 0.97  0.61 - 1.24 mg/dL Final  . Calcium 05/30/2017 9.4  8.9 - 10.3 mg/dL Final  . Total Protein 05/30/2017 7.4  6.5 - 8.1 g/dL Final  . Albumin 05/30/2017 4.5  3.5 - 5.0 g/dL Final  . AST 05/30/2017 18  15 - 41 U/L Final  . ALT 05/30/2017 16* 17 - 63 U/L Final  . Alkaline Phosphatase 05/30/2017 79  38 - 126 U/L Final  . Total Bilirubin 05/30/2017 0.6  0.3 - 1.2 mg/dL Final  . GFR calc non Af Amer 05/30/2017 >60  >60 mL/min Final  . GFR calc Af Amer 05/30/2017 >60  >60 mL/min Final   Comment: (NOTE) The eGFR has been calculated using the CKD EPI equation. This calculation has not been validated in all clinical situations. eGFR's persistently <60 mL/min signify possible Chronic Kidney Disease.   . Anion gap 05/30/2017 11  5 - 15 Final  . Prothrombin Time 05/30/2017 13.0  11.4 - 15.2 seconds Final  . INR 05/30/2017 0.99   Final  . ABO/RH(D) 05/30/2017 A POS   Final  . Antibody Screen 05/30/2017 NEG   Final  . Sample Expiration 05/30/2017 06/09/2017   Final  . Extend sample reason 05/30/2017 NO TRANSFUSIONS OR PREGNANCY IN THE PAST 3 MONTHS   Final  . MRSA, PCR 05/30/2017 NEGATIVE  NEGATIVE Final  . Staphylococcus aureus 05/30/2017 NEGATIVE  NEGATIVE Final   Comment: (NOTE) The Xpert SA Assay (FDA approved for NASAL specimens in patients 23 years of age and older), is one component of a comprehensive surveillance program. It is not intended to diagnose infection nor  to guide or monitor treatment.   . ABO/RH(D) 05/30/2017 A POS   Final     X-Rays:Dg Pelvis Portable  Result Date: 06/06/2017  CLINICAL DATA:  Status post left hip replacement EXAM: PORTABLE PELVIS 1-2 VIEWS COMPARISON:  Intraoperative fluoroscopy 06/06/2017 FINDINGS: Status post total left hip arthroplasty with well seated femoral and acetabular components. No periprosthetic fracture. Overlying surgical drain. IMPRESSION: Normal postoperative appearance of left total hip arthroplasty. Electronically Signed   By: Ulyses Jarred M.D.   On: 06/06/2017 13:54   Dg C-arm 1-60 Min-no Report  Result Date: 06/06/2017 Fluoroscopy was utilized by the requesting physician.  No radiographic interpretation.    EKG: Orders placed or performed during the hospital encounter of 05/30/17  . EKG 12 lead  . EKG 12 lead     Hospital Course: Patient was admitted to [facility] and taken to the OR and underwent the above state procedure without complications.  Patient tolerated the procedure well and was later transferred to the recovery room and then to the orthopaedic floor for postoperative care.  They were given PO and IV analgesics for pain control following their surgery.  They were given 24 hours of postoperative antibiotics of  Anti-infectives    Start     Dose/Rate Route Frequency Ordered Stop   06/06/17 2300  vancomycin (VANCOCIN) IVPB 1000 mg/200 mL premix     1,000 mg 200 mL/hr over 60 Minutes Intravenous Every 12 hours 06/06/17 1530 06/07/17 0027   06/06/17 0942  vancomycin (VANCOCIN) IVPB 1000 mg/200 mL premix     1,000 mg 200 mL/hr over 60 Minutes Intravenous On call to O.R. 06/06/17 9163 06/06/17 1147     and started on DVT prophylaxis in the form of Xarelto.   PT and OT were ordered for total hip protocol.  The patient was allowed to be WBAT with therapy. Discharge planning was consulted to help with postop disposition and equipment needs.  Patient had a good night on the evening of  surgery.  They started to get up OOB with therapy on day one.  Hemovac drain was pulled without difficulty.  Continued to work with therapy into day two.  Dressing was changed on day two and the incision was healing well. Patient was seen in rounds and was ready to go home.  Diet - Cardiac diet Follow up - in 2 weeks Activity - WBAT Disposition - Home Condition Upon Discharge - Good D/C Meds - See DC Summary DVT Prophylaxis - Xarelto   Discharge Instructions    Call MD / Call 911    Complete by:  As directed    If you experience chest pain or shortness of breath, CALL 911 and be transported to the hospital emergency room.  If you develope a fever above 101 F, pus (white drainage) or increased drainage or redness at the wound, or calf pain, call your surgeon's office.   Change dressing    Complete by:  As directed    You may change your dressing dressing daily with sterile 4 x 4 inch gauze dressing and paper tape.  Do not submerge the incision under water.   Constipation Prevention    Complete by:  As directed    Drink plenty of fluids.  Prune juice may be helpful.  You may use a stool softener, such as Colace (over the counter) 100 mg twice a day.  Use MiraLax (over the counter) for constipation as needed.   Diet - low sodium heart healthy    Complete by:  As directed    Discharge instructions    Complete by:  As directed    Take Xarelto for two  and a half more weeks, then discontinue Xarelto. Once the patient has completed the Xarelto, they may resume the 81 mg Aspirin.  Pick up stool softner and laxative for home use following surgery while on pain medications. Do not submerge incision under water. Please use good hand washing techniques while changing dressing each day. May shower starting three days after surgery. Please use a clean towel to pat the incision dry following showers. Continue to use ice for pain and swelling after surgery. Do not use any lotions or creams on the  incision until instructed by your surgeon.  Wear both TED hose on both legs during the day every day for three weeks, but may remove the TED hose at night at home.  Postoperative Constipation Protocol  Constipation - defined medically as fewer than three stools per week and severe constipation as less than one stool per week.  One of the most common issues patients have following surgery is constipation.  Even if you have a regular bowel pattern at home, your normal regimen is likely to be disrupted due to multiple reasons following surgery.  Combination of anesthesia, postoperative narcotics, change in appetite and fluid intake all can affect your bowels.  In order to avoid complications following surgery, here are some recommendations in order to help you during your recovery period.  Colace (docusate) - Pick up an over-the-counter form of Colace or another stool softener and take twice a day as long as you are requiring postoperative pain medications.  Take with a full glass of water daily.  If you experience loose stools or diarrhea, hold the colace until you stool forms back up.  If your symptoms do not get better within 1 week or if they get worse, check with your doctor.  Dulcolax (bisacodyl) - Pick up over-the-counter and take as directed by the product packaging as needed to assist with the movement of your bowels.  Take with a full glass of water.  Use this product as needed if not relieved by Colace only.   MiraLax (polyethylene glycol) - Pick up over-the-counter to have on hand.  MiraLax is a solution that will increase the amount of water in your bowels to assist with bowel movements.  Take as directed and can mix with a glass of water, juice, soda, coffee, or tea.  Take if you go more than two days without a movement. Do not use MiraLax more than once per day. Call your doctor if you are still constipated or irregular after using this medication for 7 days in a row.  If you continue to  have problems with postoperative constipation, please contact the office for further assistance and recommendations.  If you experience "the worst abdominal pain ever" or develop nausea or vomiting, please contact the office immediatly for further recommendations for treatment.   Do not sit on low chairs, stoools or toilet seats, as it may be difficult to get up from low surfaces    Complete by:  As directed    Driving restrictions    Complete by:  As directed    No driving until released by the physician.   Increase activity slowly as tolerated    Complete by:  As directed    Lifting restrictions    Complete by:  As directed    No lifting until released by the physician.   Patient may shower    Complete by:  As directed    You may shower without a dressing once there is  no drainage.  Do not wash over the wound.  If drainage remains, do not shower until drainage stops.   TED hose    Complete by:  As directed    Use stockings (TED hose) for 3 weeks on both leg(s).  You may remove them at night for sleeping.   Weight bearing as tolerated    Complete by:  As directed    Laterality:  left   Extremity:  Lower     Allergies as of 06/08/2017      Reactions   Penicillins Hives      Medication List    STOP taking these medications   aspirin EC 81 MG tablet   Fish Oil 1200 MG Caps     TAKE these medications   allopurinol 300 MG tablet Commonly known as:  ZYLOPRIM Take 300 mg by mouth every morning.   lisinopril 20 MG tablet Commonly known as:  PRINIVIL,ZESTRIL Take 20 mg by mouth every morning.   methocarbamol 500 MG tablet Commonly known as:  ROBAXIN Take 1 tablet (500 mg total) by mouth every 6 (six) hours as needed for muscle spasms.   oxyCODONE 5 MG immediate release tablet Commonly known as:  Oxy IR/ROXICODONE Take 1-2 tablets (5-10 mg total) by mouth every 4 (four) hours as needed for moderate pain.   rivaroxaban 10 MG Tabs tablet Commonly known as:  XARELTO Take 1  tablet (10 mg total) by mouth daily with breakfast. Take Xarelto for two and a half more weeks following discharge from the hospital, then discontinue Xarelto. Once the patient has completed the Xarelto, they may resume the 81 mg Aspirin.   simvastatin 40 MG tablet Commonly known as:  ZOCOR Take 40 mg by mouth every morning.   tamsulosin 0.4 MG Caps capsule Commonly known as:  FLOMAX Take 1 capsule (0.4 mg total) by mouth daily after breakfast.   traMADol 50 MG tablet Commonly known as:  ULTRAM Take 1-2 tablets (50-100 mg total) by mouth every 6 (six) hours as needed for moderate pain.            Discharge Care Instructions        Start     Ordered   06/07/17 0000  Weight bearing as tolerated    Question Answer Comment  Laterality left   Extremity Lower      06/07/17 0801   06/07/17 0000  Change dressing    Comments:  You may change your dressing dressing daily with sterile 4 x 4 inch gauze dressing and paper tape.  Do not submerge the incision under water.   06/07/17 0801     Follow-up Information    Gaynelle Arabian, MD. Schedule an appointment as soon as possible for a visit on 06/19/2017.   Specialty:  Orthopedic Surgery Contact information: [address] [place] [address] [phone]        [facility], [facility] Follow up.   Specialty:  [place] [facility]:  physical therapy Contact information: [place] [address] [phone]           Signed: Arlee Muslim, PA-C Orthopaedic Surgery 06/08/2017, 9:04 AM

## 2017-06-07 NOTE — Progress Notes (Signed)
,     Subjective: 1 Day Post-Op Procedure(s) (LRB): LEFT TOTAL HIP ARTHROPLASTY ANTERIOR APPROACH (Left) Patient reports pain as mild.   Patient seen in rounds with Dr. Wynelle Link. Patient is well, but has had some minor complaints of pain in the hip, requiring pain medications We will start therapy today.  If they do well with therapy and meets all goals, then will allow home later this afternoon following therapy. Plan is to go Home after hospital stay.  Objective: Vital signs in last 24 hours: Temp:  [97.6 F (36.4 C)-98.2 F (36.8 C)] 98.1 F (36.7 C) (10/18 0158) Pulse Rate:  [44-72] 63 (10/18 0158) Resp:  [6-17] 15 (10/18 0158) BP: (93-137)/(61-84) 112/61 (10/18 0158) SpO2:  [93 %-100 %] 93 % (10/18 0158) Weight:  [97.5 kg (215 lb)] 97.5 kg (215 lb) (10/17 1523)  Intake/Output from previous day:  Intake/Output Summary (Last 24 hours) at 06/07/17 0740 Last data filed at 06/07/17 0600  Gross per 24 hour  Intake             4695 ml  Output             3100 ml  Net             1595 ml    Intake/Output this shift: No intake/output data recorded.  Labs:  Recent Labs  06/07/17 0610  HGB 11.3*    Recent Labs  06/07/17 0610  WBC 10.5  RBC 4.03*  HCT 32.2*  PLT 243    Recent Labs  06/07/17 0610  NA 136  K 4.2  CL 105  CO2 23  BUN 13  CREATININE 0.91  GLUCOSE 187*  CALCIUM 8.5*   No results for input(s): LABPT, INR in the last 72 hours.  EXAM General - Patient is Alert, Appropriate and Oriented Extremity - Neurovascular intact Sensation intact distally Dorsiflexion/Plantar flexion intact Dressing - dressing C/D/I Motor Function - intact, moving foot and toes well on exam.  Hemovac pulled without difficulty.  Past Medical History:  Diagnosis Date  . Arthritis   . Elevated PSA   . Hyperlipidemia   . Hypertension   . OSA (obstructive sleep apnea)    per pt study done 2011 --  intolerent CPAP    Assessment/Plan: 1 Day Post-Op Procedure(s)  (LRB): LEFT TOTAL HIP ARTHROPLASTY ANTERIOR APPROACH (Left) Principal Problem:   OA (osteoarthritis) of hip  Estimated body mass index is 30.85 kg/m as calculated from the following:   Height as of this encounter: 5\' 10"  (1.778 m).   Weight as of this encounter: 97.5 kg (215 lb). Advance diet Up with therapy  DVT Prophylaxis - Xarelto Weight Bearing As Tolerated left Leg Hemovac Pulled Begin Therapy  If meets goals and able to go home: Discharge home with home health Diet - Cardiac diet Follow up - in 2 weeks Activity - WBAT Disposition - Home Condition Upon Discharge - Pending therapy.  D/C Meds - See DC Summary DVT Prophylaxis - Xarelto  Arlee Muslim, PA-C Orthopaedic Surgery 06/07/2017, 7:40 AM

## 2017-06-08 LAB — CBC
HCT: 32.5 % — ABNORMAL LOW (ref 39.0–52.0)
Hemoglobin: 11.2 g/dL — ABNORMAL LOW (ref 13.0–17.0)
MCH: 28.1 pg (ref 26.0–34.0)
MCHC: 34.5 g/dL (ref 30.0–36.0)
MCV: 81.7 fL (ref 78.0–100.0)
Platelets: 263 10*3/uL (ref 150–400)
RBC: 3.98 MIL/uL — ABNORMAL LOW (ref 4.22–5.81)
RDW: 13.6 % (ref 11.5–15.5)
WBC: 12.2 10*3/uL — ABNORMAL HIGH (ref 4.0–10.5)

## 2017-06-08 LAB — BASIC METABOLIC PANEL
Anion gap: 7 (ref 5–15)
BUN: 18 mg/dL (ref 6–20)
CO2: 26 mmol/L (ref 22–32)
Calcium: 8.9 mg/dL (ref 8.9–10.3)
Chloride: 104 mmol/L (ref 101–111)
Creatinine, Ser: 0.96 mg/dL (ref 0.61–1.24)
GFR calc Af Amer: >60 mL/min (ref >60)
GFR calc non Af Amer: >60 mL/min (ref >60)
Glucose, Bld: 177 mg/dL — ABNORMAL HIGH (ref 65–99)
Potassium: 4.5 mmol/L (ref 3.5–5.1)
Sodium: 137 mmol/L (ref 135–145)

## 2017-06-08 MED ORDER — TAMSULOSIN HCL 0.4 MG PO CAPS: 0.4000 mg | ORAL_CAPSULE | Freq: Every day | ORAL | 0 refills | Status: DC

## 2017-06-08 MED ORDER — TAMSULOSIN HCL 0.4 MG PO CAPS
0.4000 mg | ORAL_CAPSULE | Freq: Every day | ORAL | Status: DC
  Administered 2017-06-08: 08:00:00 0.4 mg via ORAL
  Filled 2017-06-08: qty 1

## 2017-06-08 NOTE — Progress Notes (Signed)
Patient discharged to home with family. Given all belongings, instructions, prescriptions. Wife present during teaching. Escorted to pov via w/c.

## 2017-06-08 NOTE — Progress Notes (Signed)
Physical Therapy Treatment Patient Details Name: Thomas Barnes MRN: [id number] DOB: Jun 27, 1946 Today's Date: 06/08/2017    History of Present Illness Pt s/p L THR    PT Comments    Pt progressing well with mobility and eager for dc home.  Spouse present to review therex, stairs, car transfers, shower transfers and LE dressing.     Follow Up Recommendations  DC plan and follow up therapy as arranged by surgeon;Home health PT     Equipment Recommendations  None recommended by PT    Recommendations for Other Services       Precautions / Restrictions Precautions Precautions: Fall Restrictions Weight Bearing Restrictions: No Other Position/Activity Restrictions: WBAT    Mobility  Bed Mobility Overal bed mobility: Needs Assistance Bed Mobility: Supine to Sit     Supine to sit: Supervision     General bed mobility comments: Pt self-cues for sequence and use of R LE to self assist  Transfers Overall transfer level: Needs assistance Equipment used: Rolling walker (2 wheeled) Transfers: Sit to/from Stand Sit to Stand: Min guard;Supervision         General transfer comment: pt self-cues for LE management and use of UEs to self assist  Ambulation/Gait Ambulation/Gait assistance: Min guard;Supervision Ambulation Distance (Feet): 180 Feet Assistive device: Rolling walker (2 wheeled) Gait Pattern/deviations: Step-to pattern;Decreased step length - right;Decreased step length - left;Shuffle;Trunk flexed Gait velocity: decr Gait velocity interpretation: Below normal speed for age/gender General Gait Details: min cues for posture, position from RW and initial sequence   Stairs Stairs: Yes   Stair Management: No rails;One rail Right;Forwards;With walker;Step to pattern Number of Stairs: 4 General stair comments: single step fwd and bkwd with RW and 2-step fwd with door frame  Wheelchair Mobility    Modified Rankin (Stroke Patients Only)       Balance                                             Cognition Arousal/Alertness: Awake/alert Behavior During Therapy: WFL for tasks assessed/performed Overall Cognitive Status: Within Functional Limits for tasks assessed                                        Exercises Total Joint Exercises Ankle Circles/Pumps: AROM;Both;15 reps;Supine Quad Sets: AROM;Both;10 reps;Supine Heel Slides: AAROM;Left;20 reps;Supine Hip ABduction/ADduction: AAROM;Left;15 reps;Supine Long Arc Quad: AROM;Left;15 reps;Seated    General Comments        Pertinent Vitals/Pain Pain Assessment: 0-10 Pain Score: 4  Pain Location: L hip/thigh Pain Descriptors / Indicators: Aching;Burning;Sore Pain Intervention(s): Limited activity within patient's tolerance;Monitored during session;Premedicated before session;Ice applied    Home Living                      Prior Function            PT Goals (current goals can now be found in the care plan section) Acute Rehab PT Goals Patient Stated Goal: Regain IND PT Goal Formulation: With patient Time For Goal Achievement: 06/11/17 Potential to Achieve Goals: Good Progress towards PT goals: Progressing toward goals    Frequency    7X/week      PT Plan Current plan remains appropriate    Co-evaluation  AM-PAC PT "6 Clicks" Daily Activity  Outcome Measure  Difficulty turning over in bed (including adjusting bedclothes, sheets and blankets)?: A Lot Difficulty moving from lying on back to sitting on the side of the bed? : A Little Difficulty sitting down on and standing up from a chair with arms (e.g., wheelchair, bedside commode, etc,.)?: A Little Help needed moving to and from a bed to chair (including a wheelchair)?: A Little Help needed walking in hospital room?: A Little Help needed climbing 3-5 steps with a railing? : A Little 6 Click Score: 17    End of Session Equipment Utilized During Treatment:  Gait belt Activity Tolerance: Patient tolerated treatment well Patient left: in chair;with call bell/phone within reach;with family/visitor present Nurse Communication: Mobility status PT Visit Diagnosis: Difficulty in walking, not elsewhere classified (R26.2)     Time: 9563-8756 PT Time Calculation (min) (ACUTE ONLY): 47 min  Charges:  $Gait Training: 8-22 mins $Therapeutic Exercise: 8-22 mins $Therapeutic Activity: 8-22 mins                    G Codes:       Pg [phone]    Quatisha Zylka 06/08/2017, 9:38 AM

## 2017-06-08 NOTE — Progress Notes (Signed)
   Subjective: 2 Days Post-Op Procedure(s) (LRB): LEFT TOTAL HIP ARTHROPLASTY ANTERIOR APPROACH (Left) Patient reports pain as mild.   Patient seen in rounds with Dr. Wynelle Link. Patient is well, and has had no acute complaints or problems Patient is ready to go home today after therapy  Objective: Vital signs in last 24 hours: Temp:  [97.6 F (36.4 C)-98 F (36.7 C)] 97.6 F (36.4 C) (10/19 0610) Pulse Rate:  [56-68] 68 (10/19 0610) Resp:  [15-18] 18 (10/19 0610) BP: (103-125)/(65-73) 118/73 (10/19 0610) SpO2:  [94 %-98 %] 97 % (10/19 0610)  Intake/Output from previous day:  Intake/Output Summary (Last 24 hours) at 06/08/17 0901 Last data filed at 06/08/17 0845  Gross per 24 hour  Intake          1780.83 ml  Output             2650 ml  Net          -869.17 ml    Intake/Output this shift: Total I/O In: 360 [P.O.:360] Out: -   Labs:  Recent Labs  06/07/17 0610 06/08/17 0551  HGB 11.3* 11.2*    Recent Labs  06/07/17 0610 06/08/17 0551  WBC 10.5 12.2*  RBC 4.03* 3.98*  HCT 32.2* 32.5*  PLT 243 263    Recent Labs  06/07/17 0610 06/08/17 0551  NA 136 137  K 4.2 4.5  CL 105 104  CO2 23 26  BUN 13 18  CREATININE 0.91 0.96  GLUCOSE 187* 177*  CALCIUM 8.5* 8.9   No results for input(s): LABPT, INR in the last 72 hours.  EXAM: General - Patient is Alert, Appropriate and Oriented Extremity - Neurovascular intact Sensation intact distally Intact pulses distally Dorsiflexion/Plantar flexion intact Incision - clean, dry, no drainage Motor Function - intact, moving foot and toes well on exam.   Assessment/Plan: 2 Days Post-Op Procedure(s) (LRB): LEFT TOTAL HIP ARTHROPLASTY ANTERIOR APPROACH (Left) Procedure(s) (LRB): LEFT TOTAL HIP ARTHROPLASTY ANTERIOR APPROACH (Left) Past Medical History:  Diagnosis Date  . Arthritis   . Elevated PSA   . Hyperlipidemia   . Hypertension   . OSA (obstructive sleep apnea)    per pt study done 2011 --  intolerent  CPAP   Principal Problem:   OA (osteoarthritis) of hip  Estimated body mass index is 30.85 kg/m as calculated from the following:   Height as of this encounter: 5\' 10"  (6.629 m).   Weight as of this encounter: 97.5 kg (215 lb). Up with therapy Diet - Cardiac diet Follow up - in 2 weeks Activity - WBAT Disposition - Home Condition Upon Discharge - Good D/C Meds - See DC Summary DVT Prophylaxis - Xarelto  Arlee Muslim, PA-C Orthopaedic Surgery 06/08/2017, 9:01 AM

## 2017-08-06 ENCOUNTER — Other Ambulatory Visit: Payer: Self-pay | Admitting: Urology

## 2017-08-06 DIAGNOSIS — R972 Elevated prostate specific antigen [PSA]: Secondary | ICD-10-CM

## 2017-08-29 ENCOUNTER — Ambulatory Visit
Admission: RE | Admit: 2017-08-29 | Discharge: 2017-08-29 | Disposition: A | Discharge status: Home / Self Care | Payer: Medicare Other | Source: Ambulatory Visit | Attending: Urology | Admitting: Urology

## 2017-08-29 DIAGNOSIS — R972 Elevated prostate specific antigen [PSA]: Secondary | ICD-10-CM

## 2017-08-29 MED ORDER — GADOBENATE DIMEGLUMINE 529 MG/ML IV SOLN
20.0000 mL | Freq: Once | INTRAVENOUS | Status: AC | PRN
  Administered 2017-08-29: 20 mL via INTRAVENOUS

## 2017-09-11 ENCOUNTER — Other Ambulatory Visit: Payer: Self-pay | Admitting: Urology

## 2017-09-11 ENCOUNTER — Other Ambulatory Visit (HOSPITAL_COMMUNITY): Payer: Self-pay | Admitting: Urology

## 2017-09-11 DIAGNOSIS — R972 Elevated prostate specific antigen [PSA]: Secondary | ICD-10-CM

## 2017-09-18 ENCOUNTER — Encounter (HOSPITAL_COMMUNITY): Payer: Self-pay

## 2017-09-18 NOTE — Patient Instructions (Addendum)
Thomas Barnes  09/18/2017   Your procedure is scheduled on: 09/27/2017    Report to [facility] Main  Entrance  Report to admitting at   200pm   Call this number if you have problems the morning of surgery [phone]   Remember: Do not eat food after midnite,  May have clear liquids from 12 midnite until 1000am morning of surgery then nothing by mouth.       Take these medicines the morning of surgery with A SIP OF WATER: none                                You may not have any metal on your body including hair pins and              piercings  Do not wear jewelry, , lotions, powders or perfumes, deodorant                         Men may shave face and neck.   Do not bring valuables to the hospital. [place].  Contacts, dentures or bridgework may not be worn into surgery. .     Patients discharged the day of surgery will not be allowed to drive home.  Name and phone number of your driver:     [place] Allowed                                                                     Foods Excluded  Coffee and tea, regular and decaf                             liquids that you cannot  Plain Jell-O in any flavor                                             see through such as: Fruit ices (not with fruit pulp)                                     milk, soups, orange juice  Iced Popsicles                                    All solid food Carbonated beverages, regular and diet                                    Cranberry, grape and apple juices Sports drinks like Gatorade Lightly seasoned clear broth or consume(fat free) Sugar, honey syrup  Sample Menu Breakfast  Lunch                                     Supper Cranberry juice                    Beef broth                            Chicken broth Jell-O                                     Grape juice                            Apple juice Coffee or tea                        Jell-O                                      Popsicle                                                Coffee or tea                        Coffee or tea  _____________________________________________________________________                Please read over the following fact sheets you were given: _____________________________________________________________________             [facility] - Preparing for Surgery Before surgery, you can play an important role.  Because skin is not sterile, your skin needs to be as free of germs as possible.  You can reduce the number of germs on your skin by washing with CHG (chlorahexidine gluconate) soap before surgery.  CHG is an antiseptic cleaner which kills germs and bonds with the skin to continue killing germs even after washing. Please DO NOT use if you have an allergy to CHG or antibacterial soaps.  If your skin becomes reddened/irritated stop using the CHG and inform your nurse when you arrive at Short Stay. Do not shave (including legs and underarms) for at least 48 hours prior to the first CHG shower.  You may shave your face/neck. Please follow these instructions carefully:  1.  Shower with CHG Soap the night before surgery and the  morning of Surgery.  2.  If you choose to wash your hair, wash your hair first as usual with your  normal  shampoo.  3.  After you shampoo, rinse your hair and body thoroughly to remove the  shampoo.                           4.  Use CHG as you would any other liquid soap.  You can apply chg directly  to the skin and wash  Gently with a scrungie or clean washcloth.  5.  Apply the CHG Soap to your body ONLY FROM THE NECK DOWN.   Do not use on face/ open                           Wound or open sores. Avoid contact with eyes, ears mouth and genitals (private parts).                       Wash face,  Genitals (private parts) with your  normal soap.             6.  Wash thoroughly, paying special attention to the area where your surgery  will be performed.  7.  Thoroughly rinse your body with warm water from the neck down.  8.  DO NOT shower/wash with your normal soap after using and rinsing off  the CHG Soap.                9.  Pat yourself dry with a clean towel.            10.  Wear clean pajamas.            11.  Place clean sheets on your bed the night of your first shower and do not  sleep with pets. Day of Surgery : Do not apply any lotions/deodorants the morning of surgery.  Please wear clean clothes to the hospital/surgery center.  FAILURE TO FOLLOW THESE INSTRUCTIONS MAY RESULT IN THE CANCELLATION OF YOUR SURGERY PATIENT SIGNATURE_________________________________  NURSE SIGNATURE__________________________________  ________________________________________________________________________

## 2017-09-19 ENCOUNTER — Other Ambulatory Visit: Payer: Self-pay | Admitting: Urology

## 2017-09-19 ENCOUNTER — Encounter (HOSPITAL_COMMUNITY): Payer: Self-pay

## 2017-09-19 ENCOUNTER — Other Ambulatory Visit: Payer: Self-pay

## 2017-09-19 ENCOUNTER — Encounter (HOSPITAL_COMMUNITY)
Admission: RE | Admit: 2017-09-19 | Discharge: 2017-09-19 | Disposition: A | Discharge status: Home / Self Care | Payer: Medicare Other | Source: Ambulatory Visit | Attending: Urology | Admitting: Urology

## 2017-09-19 DIAGNOSIS — R972 Elevated prostate specific antigen [PSA]: Secondary | ICD-10-CM | POA: Insufficient documentation

## 2017-09-19 DIAGNOSIS — Z01812 Encounter for preprocedural laboratory examination: Secondary | ICD-10-CM | POA: Insufficient documentation

## 2017-09-19 LAB — CBC
HCT: 41.1 % (ref 39.0–52.0)
Hemoglobin: 14.2 g/dL (ref 13.0–17.0)
MCH: 27 pg (ref 26.0–34.0)
MCHC: 34.5 g/dL (ref 30.0–36.0)
MCV: 78.1 fL (ref 78.0–100.0)
Platelets: 258 10*3/uL (ref 150–400)
RBC: 5.26 MIL/uL (ref 4.22–5.81)
RDW: 14.4 % (ref 11.5–15.5)
WBC: 5.5 10*3/uL (ref 4.0–10.5)

## 2017-09-19 NOTE — Progress Notes (Addendum)
INTERVIEWED PT AT PAT VISIT TODAY  FOR SURGERY ON 09-27-2017 .  PT ASKED ABOUT FLEET ENEMA AM DOS SINCE HE HAS DONE BEFORE FOR SAME PROCEDURE.  NO ORDER FOR THIS IN MD PRE-ORDERS.  CALLED AND LM FOR CONI, OR SCHEDULER FOR DR Alinda Money,  THE PT ASKING ABOUT FLEET ENEMA AND TO PLEASE GIVE PT A CALL IF HE IS TO DO ENEMA OR NOT.  ADDENDUM:  09-18-2017 -- CURRENT EKG IN Epic DATED 05-30-2017

## 2017-09-20 MED ORDER — FLEET ENEMA 7-19 GM/118ML RE ENEM: 1.0000 | ENEMA | Freq: Once | RECTAL | Status: AC

## 2017-09-26 NOTE — H&P (Signed)
     CC/HPI: Elevated PSA   He follows up today for further evaluation of his elevated PSA with a negative prior biopsy 1 year ago. He has remained in stable overall health and denies any new voiding complaints.     ALLERGIES: Penicillin    MEDICATIONS: Allopurinol 300 mg tablet  Lisinopril 20 mg tablet  Simvastatin 40 mg tablet  Aspirin Ec 81 mg tablet, delayed release  Fish Oil 1,000 mg (120 mg-180 mg) capsule     GU PSH: None   NON-GU PSH: Hip Replacement, Left Tonsillectomy.. - 1953    GU PMH: Elevated PSA - 04/18/2016      PMH Notes:   1) Elevated PSA: He presented in August 2017 with a PSA of 3.6 and induration of the right medial base of the prostate.   Oct 2017: 12 core biopsy - Benign (performed in OR)   NON-GU PMH: Gout Hypercholesterolemia Hypertension Sleep Apnea    FAMILY HISTORY: ovarian cancer - Mother prostate cancer in father - Father   SOCIAL HISTORY: Marital Status: Married Preferred Language: English; Ethnicity: Not Hispanic Or Latino; Race: White Current Smoking Status: Patient has never smoked.  Has never drank.  Does not drink caffeine.    REVIEW OF SYSTEMS:    GU Review Male:   Patient denies frequent urination, hard to postpone urination, burning/ pain with urination, get up at night to urinate, leakage of urine, stream starts and stops, trouble starting your streams, and have to strain to urinate .  Gastrointestinal (Lower):   Patient denies diarrhea and constipation.  Gastrointestinal (Upper):   Patient denies nausea and vomiting.  Constitutional:   Patient denies fever, night sweats, weight loss, and fatigue.  Skin:   Patient denies skin rash/ lesion and itching.  Eyes:   Patient denies blurred vision and double vision.  Ears/ Nose/ Throat:   Patient denies sore throat and sinus problems.  Hematologic/Lymphatic:   Patient denies swollen glands and easy bruising.  Cardiovascular:   Patient denies leg swelling and chest pains.   Respiratory:   Patient denies cough and shortness of breath.  Endocrine:   Patient denies excessive thirst.  Musculoskeletal:   Patient denies back pain and joint pain.  Neurological:   Patient denies headaches and dizziness.  Psychologic:   Patient denies depression and anxiety.     GU PHYSICAL EXAMINATION:    Prostate: Prostate about 60 grams. The previously noted induration toward the right medial base of the prostate is no longer appreciated. He does have some very slight induration toward the right apex. This is located somewhat toward the middle aspect of the right apical gland.   MULTI-SYSTEM PHYSICAL EXAMINATION:    Constitutional: Well-nourished. No physical deformities. Normally developed. Good grooming.     PAST DATA REVIEWED:  Source Of History:  Patient  Lab Test Review:   PSA  Urine Test Review:   Urinalysis   07/11/17 11/29/16  PSA  Total PSA 7.51 ng/mL 3.40 ng/dl  Free PSA 0.70 ng/mL   % Free PSA 9 % PSA     ASSESSMENT:      ICD-10 Details  1 GU:   Elevated PSA - R97.20    PLAN:           1. Elevated PSA: He was noted to have a 1.8 cm PI-RADS 5 lesion and another PI-RADS 3 lesion on his recent MRI and will undergo a cognitive fusion prostate biopsy today.

## 2017-09-26 NOTE — Progress Notes (Signed)
Patient's wife called and stated that patient has headache today and was wondering what he could take for headache.  Asked wife had patient received from Dr Alinda Money list of medications to stop prior to surgery.  Wife stated he had that list.  Informed wife that he could take medication for headache as long as not on list of medications to stop prior to surgery.  Wife asked if patient could take Tylenol. Informed wife that as long as patient was able to take Tylenol and not on stop list prior to surgery would be okay to take. Wife voiced understanding.

## 2017-09-27 ENCOUNTER — Encounter (HOSPITAL_COMMUNITY): Payer: Self-pay | Admitting: *Deleted

## 2017-09-27 ENCOUNTER — Encounter (HOSPITAL_COMMUNITY)
Admission: RE | Disposition: A | Discharge status: Home / Self Care | Payer: Self-pay | Source: Ambulatory Visit | Attending: Urology

## 2017-09-27 ENCOUNTER — Other Ambulatory Visit: Payer: Self-pay

## 2017-09-27 ENCOUNTER — Ambulatory Visit (HOSPITAL_COMMUNITY): Payer: Medicare Other | Admitting: Anesthesiology

## 2017-09-27 ENCOUNTER — Ambulatory Visit (HOSPITAL_COMMUNITY)
Admission: RE | Admit: 2017-09-27 | Discharge: 2017-09-27 | Disposition: A | Discharge status: Home / Self Care | Payer: Medicare Other | Source: Ambulatory Visit | Attending: Urology | Admitting: Urology

## 2017-09-27 DIAGNOSIS — I10 Essential (primary) hypertension: Secondary | ICD-10-CM | POA: Diagnosis not present

## 2017-09-27 DIAGNOSIS — Z7982 Long term (current) use of aspirin: Secondary | ICD-10-CM | POA: Insufficient documentation

## 2017-09-27 DIAGNOSIS — M1990 Unspecified osteoarthritis, unspecified site: Secondary | ICD-10-CM | POA: Insufficient documentation

## 2017-09-27 DIAGNOSIS — Z79899 Other long term (current) drug therapy: Secondary | ICD-10-CM | POA: Insufficient documentation

## 2017-09-27 DIAGNOSIS — Z8041 Family history of malignant neoplasm of ovary: Secondary | ICD-10-CM | POA: Diagnosis not present

## 2017-09-27 DIAGNOSIS — C61 Malignant neoplasm of prostate: Secondary | ICD-10-CM | POA: Insufficient documentation

## 2017-09-27 DIAGNOSIS — Z880 Allergy status to penicillin: Secondary | ICD-10-CM | POA: Diagnosis not present

## 2017-09-27 DIAGNOSIS — R972 Elevated prostate specific antigen [PSA]: Secondary | ICD-10-CM | POA: Diagnosis not present

## 2017-09-27 DIAGNOSIS — Z8042 Family history of malignant neoplasm of prostate: Secondary | ICD-10-CM | POA: Diagnosis not present

## 2017-09-27 HISTORY — PX: TRANSRECTAL ULTRASOUND: SHX5146

## 2017-09-27 HISTORY — PX: PROSTATE BIOPSY: SHX241

## 2017-09-27 SURGERY — ULTRASOUND, RECTAL APPROACH
Anesthesia: Monitor Anesthesia Care

## 2017-09-27 MED ORDER — DEXTROSE 5 % IV SOLN
2.0000 g | Freq: Once | INTRAVENOUS | Status: AC
  Administered 2017-09-27: 2 g via INTRAVENOUS
  Filled 2017-09-27: qty 20

## 2017-09-27 MED ORDER — PROPOFOL 10 MG/ML IV BOLUS
INTRAVENOUS | Status: AC
  Filled 2017-09-27: qty 40

## 2017-09-27 MED ORDER — LIDOCAINE HCL 2 % IJ SOLN
INTRAMUSCULAR | Status: AC
  Filled 2017-09-27: qty 20

## 2017-09-27 MED ORDER — OXYCODONE HCL 5 MG PO TABS
5.0000 mg | ORAL_TABLET | Freq: Once | ORAL | Status: AC | PRN
  Administered 2017-09-27: 5 mg via ORAL

## 2017-09-27 MED ORDER — LIDOCAINE HCL 2 % EX GEL
CUTANEOUS | Status: AC
  Filled 2017-09-27: qty 5

## 2017-09-27 MED ORDER — MEPERIDINE HCL 50 MG/ML IJ SOLN: 6.2500 mg | INTRAMUSCULAR | Status: DC | PRN

## 2017-09-27 MED ORDER — FENTANYL CITRATE (PF) 100 MCG/2ML IJ SOLN
25.0000 ug | INTRAMUSCULAR | Status: DC | PRN
  Administered 2017-09-27: 25 ug via INTRAVENOUS

## 2017-09-27 MED ORDER — ONDANSETRON HCL 4 MG/2ML IJ SOLN
INTRAMUSCULAR | Status: AC
  Filled 2017-09-27: qty 2

## 2017-09-27 MED ORDER — LIDOCAINE HCL 2 % EX GEL
CUTANEOUS | Status: DC | PRN
  Administered 2017-09-27: 1 via TOPICAL

## 2017-09-27 MED ORDER — SODIUM CHLORIDE 0.9 % IV SOLN: INTRAVENOUS | Status: DC

## 2017-09-27 MED ORDER — PROMETHAZINE HCL 25 MG/ML IJ SOLN: 6.2500 mg | INTRAMUSCULAR | Status: DC | PRN

## 2017-09-27 MED ORDER — PROPOFOL 500 MG/50ML IV EMUL
INTRAVENOUS | Status: DC | PRN
  Administered 2017-09-27: 40 mg via INTRAVENOUS
  Administered 2017-09-27: 20 mg via INTRAVENOUS

## 2017-09-27 MED ORDER — LACTATED RINGERS IV SOLN
INTRAVENOUS | Status: DC
  Administered 2017-09-27: 15:00:00 via INTRAVENOUS

## 2017-09-27 MED ORDER — PROPOFOL 500 MG/50ML IV EMUL
INTRAVENOUS | Status: DC | PRN
  Administered 2017-09-27: 75 ug/kg/min via INTRAVENOUS

## 2017-09-27 MED ORDER — FENTANYL CITRATE (PF) 100 MCG/2ML IJ SOLN
INTRAMUSCULAR | Status: AC
  Filled 2017-09-27: qty 2

## 2017-09-27 MED ORDER — FENTANYL CITRATE (PF) 100 MCG/2ML IJ SOLN
INTRAMUSCULAR | Status: DC | PRN
  Administered 2017-09-27: 50 ug via INTRAVENOUS

## 2017-09-27 MED ORDER — DEXAMETHASONE SODIUM PHOSPHATE 10 MG/ML IJ SOLN
INTRAMUSCULAR | Status: AC
  Filled 2017-09-27: qty 1

## 2017-09-27 MED ORDER — FENTANYL CITRATE (PF) 100 MCG/2ML IJ SOLN
INTRAMUSCULAR | Status: AC
  Administered 2017-09-27: 25 ug via INTRAVENOUS
  Filled 2017-09-27: qty 2

## 2017-09-27 MED ORDER — OXYCODONE HCL 5 MG PO TABS
ORAL_TABLET | ORAL | Status: AC
  Filled 2017-09-27: qty 1

## 2017-09-27 MED ORDER — ONDANSETRON HCL 4 MG/2ML IJ SOLN
INTRAMUSCULAR | Status: DC | PRN
  Administered 2017-09-27: 4 mg via INTRAVENOUS

## 2017-09-27 MED ORDER — LIDOCAINE HCL 2 % IJ SOLN
INTRAMUSCULAR | Status: DC | PRN
  Administered 2017-09-27: 10 mL

## 2017-09-27 SURGICAL SUPPLY — 10 items
CONT SPEC 4OZ CLIKSEAL STRL BL (MISCELLANEOUS) ×3 IMPLANT
COVER SURGICAL LIGHT HANDLE (MISCELLANEOUS) IMPLANT
GLOVE BIOGEL M STRL SZ7.5 (GLOVE) ×3 IMPLANT
INST BIOPSY MAXCORE 18GX25 (NEEDLE) IMPLANT
INSTR BIOPSY MAXCORE 18GX20 (NEEDLE) ×3 IMPLANT
NEEDLE HYPO 22GX1.5 SAFETY (NEEDLE) IMPLANT
NEEDLE SPNL 22GX7 QUINCKE BK (NEEDLE) ×3 IMPLANT
SYR CONTROL 10ML LL (SYRINGE) ×3 IMPLANT
TOWEL OR NON WOVEN STRL DISP B (DISPOSABLE) ×3 IMPLANT
UNDERPAD 30X30 (UNDERPADS AND DIAPERS) ×3 IMPLANT

## 2017-09-27 NOTE — Anesthesia Preprocedure Evaluation (Addendum)
Anesthesia Evaluation  Patient identified by MRN, date of birth, ID band Patient awake    Reviewed: Allergy & Precautions, NPO status , Patient's Chart, lab work & pertinent test results  Airway Mallampati: II  TM Distance: >3 FB Neck ROM: Full    Dental  (+) Dental Advisory Given   Pulmonary sleep apnea ,    breath sounds clear to auscultation       Cardiovascular hypertension, Pt. on medications (-) angina Rhythm:Regular Rate:Normal     Neuro/Psych negative neurological ROS     GI/Hepatic negative GI ROS, Neg liver ROS,   Endo/Other  negative endocrine ROS  Renal/GU negative Renal ROS     Musculoskeletal  (+) Arthritis ,   Abdominal   Peds  Hematology negative hematology ROS (+)   Anesthesia Other Findings   Reproductive/Obstetrics                             Lab Results  Component Value Date   WBC 5.5 09/19/2017   HGB 14.2 09/19/2017   HCT 41.1 09/19/2017   MCV 78.1 09/19/2017   PLT 258 09/19/2017   Lab Results  Component Value Date   INR 0.99 05/30/2017   Lab Results  Component Value Date   CREATININE 0.96 06/08/2017   BUN 18 06/08/2017   NA 137 06/08/2017   K 4.5 06/08/2017   CL 104 06/08/2017   CO2 26 06/08/2017    Anesthesia Physical  Anesthesia Plan  ASA: II  Anesthesia Plan: MAC   Post-op Pain Management:    Induction:   PONV Risk Score and Plan: 1 and Ondansetron, Propofol infusion and Treatment may vary due to age or medical condition  Airway Management Planned: Natural Airway and Simple Face Mask  Additional Equipment:   Intra-op Plan:   Post-operative Plan:   Informed Consent: I have reviewed the patients History and Physical, chart, labs and discussed the procedure including the risks, benefits and alternatives for the proposed anesthesia with the patient or authorized representative who has indicated his/her understanding and acceptance.    Dental advisory given  Plan Discussed with: CRNA  Anesthesia Plan Comments:         Anesthesia Quick Evaluation

## 2017-09-27 NOTE — Anesthesia Procedure Notes (Signed)
Date/Time: 09/27/2017 4:20 PM Performed by: Glory Buff, CRNA Oxygen Delivery Method: Simple face mask

## 2017-09-27 NOTE — Anesthesia Postprocedure Evaluation (Signed)
Anesthesia Post Note  Patient: Thomas Barnes  Procedure(s) Performed: TRANSRECTAL ULTRASOUND (N/A ) BIOPSY TRANSRECTAL ULTRASONIC PROSTATE (TUBP) (N/A )     Patient location during evaluation: PACU Anesthesia Type: MAC Level of consciousness: awake and alert Pain management: pain level controlled Vital Signs Assessment: post-procedure vital signs reviewed and stable Respiratory status: spontaneous breathing Cardiovascular status: stable Anesthetic complications: no    Last Vitals:  Vitals:   09/27/17 1745 09/27/17 1818  BP: (!) 165/97 (!) 149/79  Pulse: 61 65  Resp: 18 18  Temp: 36.6 C   SpO2: 98% 98%    Last Pain:  Vitals:   09/27/17 1818  TempSrc:   PainSc: [place]

## 2017-09-27 NOTE — Op Note (Signed)
Preoperative diagnosis: Elevated PSA  Postoperative diagnosis: Elevated PSA  Procedure: Transrectal ultrasound guided biopsy of the prostate, prostate ultrasound  Surgeon: Pryor Curia MD  Anesthesia: IV sedation  Estimated blood loss: Minimal  Specimens: 1.  Right lateral base 2.  Right base 3.  Right lateral mid 4.  Right mid 5.  Right lateral apex 6.  Right apex 7.  Left lateral base 8.  Left base 9.  Left lateral mid 10.  Left mid 11.  Left lateral apex 12.  Left apex 13.  Transition zone biopsies x2 14.  Region of interest 1 x 2 15.  Region of interest 1 x 2 (second bottle) 16.  Region of interest 2 x 2  Disposition of specimens: Pathology  Indication: Thomas Barnes is a 72 year old gentleman with a persistent elevation of his PSA.  He has undergone a prior negative biopsy and due to his persistent elevation of his PSA, he recently underwent an MRI that demonstrated a PI-RADS 5 lesion at the right lateral mid gland and a PI-RADS 3 lesion at the right lateral apex. He has been unable to tolerate TRUS biopsy in the office and presents today for a prostate biopsy in the OR with cognitive MR fusion. I discussed the potential benefits and risks of the procedure, side effects of the proposed treatment, the likelihood of the patient achieving the goals of the procedure, and any potential problems that might occur during the procedure or recuperation. He gave informed consent.  Description of procedure: He was taken to the operating room and IV sedation was administered.  He was given preoperative antibiotics, placed in the left lateral decubitus position, and administered topical lidocaine jelly per rectum.  I then placed the ultrasound probe and visualize the prostate.  A prostate ultrasound was performed examining the prostate in both the sagittal and transverse views.  No obvious hypoechoic or otherwise concerning lesions on ultrasound were identified.  I then performed a  periprostatic nerve block with 10 cc of 2% lidocaine.  Using ultrasound guidance, I then performed needle biopsies of the prostate from the above located specimen sites.  A standard 12 core biopsy was performed systematically from the right and left side of the prostate in both the lateral and mid sagittal sections from base, mid, and apex.  All biopsies were performed under ultrasound guidance.  I then performed 2 additional biopsies of the transition zone on the right and left.  Using cognitive MR fusion, I then performed 4 biopsies from the concerning PI-RADS 5 lesion located at the right mid lateral aspect anteriorly.  I also performed 2 biopsies of the right lateral apex correlating to the PI-RADS 3 lesion located on MRI.  The ultrasound probe was then removed.  A digital rectal exam was performed and there was minimal bleeding.  The patient tolerated the procedure well without complications.  He was able to be transferred to the recovery unit in satisfactory condition.

## 2017-09-27 NOTE — Discharge Instructions (Signed)
Please call if you develop fever > 101, have excessive bleeding from the rectum, or excessive blood in the urine.  Some blood in the urine or with bowel movements is expected.

## 2017-09-27 NOTE — Transfer of Care (Signed)
Immediate Anesthesia Transfer of Care Note  Patient: Thomas Barnes  Procedure(s) Performed: TRANSRECTAL ULTRASOUND (N/A ) BIOPSY TRANSRECTAL ULTRASONIC PROSTATE (TUBP) (N/A )  Patient Location: PACU  Anesthesia Type:MAC  Level of Consciousness: awake, alert  and oriented  Airway & Oxygen Therapy: Patient Spontanous Breathing and Patient connected to face mask oxygen  Post-op Assessment: Report given to RN and Post -op Vital signs reviewed and stable  Post vital signs: Reviewed and stable  Last Vitals:  Vitals:   09/27/17 1426  BP: 139/88  Pulse: 71  Resp: 18  Temp: 36.8 C  SpO2: 97%    Last Pain:  Vitals:   09/27/17 1440  TempSrc:   PainSc: 1       Patients Stated Pain Goal: 2 (41/03/01 3143)  Complications: No apparent anesthesia complications

## 2017-09-28 ENCOUNTER — Encounter (HOSPITAL_COMMUNITY): Payer: Self-pay | Admitting: Urology

## 2017-10-04 ENCOUNTER — Other Ambulatory Visit (HOSPITAL_COMMUNITY): Payer: Self-pay | Admitting: Urology

## 2017-10-04 DIAGNOSIS — C61 Malignant neoplasm of prostate: Secondary | ICD-10-CM

## 2017-10-10 ENCOUNTER — Encounter (HOSPITAL_COMMUNITY)
Admission: RE | Admit: 2017-10-10 | Discharge: 2017-10-10 | Disposition: A | Discharge status: Home / Self Care | Payer: Medicare Other | Source: Ambulatory Visit | Attending: Urology | Admitting: Urology

## 2017-10-10 DIAGNOSIS — C61 Malignant neoplasm of prostate: Secondary | ICD-10-CM | POA: Insufficient documentation

## 2017-10-10 MED ORDER — TECHNETIUM TC 99M MEDRONATE IV KIT
25.0000 | PACK | Freq: Once | INTRAVENOUS | Status: AC | PRN
  Administered 2017-10-10: 25 via INTRAVENOUS

## 2017-10-19 ENCOUNTER — Other Ambulatory Visit: Payer: Self-pay | Admitting: Urology

## 2017-11-09 ENCOUNTER — Encounter (HOSPITAL_COMMUNITY): Payer: Self-pay

## 2017-11-09 NOTE — Pre-Procedure Instructions (Signed)
EKG 05/30/2017 in epic.

## 2017-11-09 NOTE — Pre-Procedure Instructions (Signed)
Spoke with Coni to request surgical clearance for Mr. Gonzalez.

## 2017-11-09 NOTE — Patient Instructions (Signed)
Your procedure is scheduled on: Thursday, November 22, 2017   Surgery Time:  11:30AM-3:00PM   Report to [facility] Main  Entrance    Report to admitting at 9:30 AM   Call this number if you have problems the morning of surgery [phone]   Magnesium Citrate:  8 OZ MAGNESIUM CITRATE AT NOON DAY BEFORE SURGERY   Fleet Enema:  ONE FLEET ENEMA NIGHT BEFORE SURGERY   Do not eat food or drink liquids :After Midnight.   Do NOT smoke after Midnight   Take these medicines the morning of surgery with A SIP OF WATER: Simvastatin                               You may not have any metal on your body including  jewelry, and body piercings             Do not wear lotions, powders, perfumes/cologne, or deodorant                          Men may shave face and neck.   Do not bring valuables to the hospital. [place].   Contacts, dentures or bridgework may not be worn into surgery.   Leave suitcase in the car. After surgery it may be brought to your room.   Special Instructions: Bring a copy of your healthcare power of attorney and living will documents         the day of surgery if you haven't scanned them in before.              Please read over the following fact sheets you were given:  [facility] - Preparing for Surgery Before surgery, you can play an important role.  Because skin is not sterile, your skin needs to be as free of germs as possible.  You can reduce the number of germs on your skin by washing with CHG (chlorahexidine gluconate) soap before surgery.  CHG is an antiseptic cleaner which kills germs and bonds with the skin to continue killing germs even after washing. Please DO NOT use if you have an allergy to CHG or antibacterial soaps.  If your skin becomes reddened/irritated stop using the CHG and inform your nurse when you arrive at Short Stay. Do not shave (including legs and underarms) for at least 48 hours  prior to the first CHG shower.  You may shave your face/neck.  Please follow these instructions carefully:  1.  Shower with CHG Soap the night before surgery and the  morning of surgery.  2.  If you choose to wash your hair, wash your hair first as usual with your normal  shampoo.  3.  After you shampoo, rinse your hair and body thoroughly to remove the shampoo.                             4.  Use CHG as you would any other liquid soap.  You can apply chg directly to the skin and wash.  Gently with a scrungie or clean washcloth.  5.  Apply the CHG Soap to your body ONLY FROM THE NECK DOWN.   Do   not use on face/ open  Wound or open sores. Avoid contact with eyes, ears mouth and   genitals (private parts).                       Wash face,  Genitals (private parts) with your normal soap.             6.  Wash thoroughly, paying special attention to the area where your    surgery  will be performed.  7.  Thoroughly rinse your body with warm water from the neck down.  8.  DO NOT shower/wash with your normal soap after using and rinsing off the CHG Soap.                9.  Pat yourself dry with a clean towel.            10.  Wear clean pajamas.            11.  Place clean sheets on your bed the night of your first shower and do not  sleep with pets. Day of Surgery : Do not apply any lotions/deodorants the morning of surgery.  Please wear clean clothes to the hospital/surgery center.  FAILURE TO FOLLOW THESE INSTRUCTIONS MAY RESULT IN THE CANCELLATION OF YOUR SURGERY  PATIENT SIGNATURE_________________________________  NURSE SIGNATURE__________________________________  ________________________________________________________________________   Thomas Barnes  An incentive spirometer is a tool that can help keep your lungs clear and active. This tool measures how well you are filling your lungs with each breath. Taking long deep breaths may help reverse or  decrease the chance of developing breathing (pulmonary) problems (especially infection) following:  A long period of time when you are unable to move or be active. BEFORE THE PROCEDURE   If the spirometer includes an indicator to show your best effort, your nurse or respiratory therapist will set it to a desired goal.  If possible, sit up straight or lean slightly forward. Try not to slouch.  Hold the incentive spirometer in an upright position. INSTRUCTIONS FOR USE  1. Sit on the edge of your bed if possible, or sit up as far as you can in bed or on a chair. 2. Hold the incentive spirometer in an upright position. 3. Breathe out normally. 4. Place the mouthpiece in your mouth and seal your lips tightly around it. 5. Breathe in slowly and as deeply as possible, raising the piston or the ball toward the top of the column. 6. Hold your breath for 3-5 seconds or for as long as possible. Allow the piston or ball to fall to the bottom of the column. 7. Remove the mouthpiece from your mouth and breathe out normally. 8. Rest for a few seconds and repeat Steps 1 through 7 at least 10 times every 1-2 hours when you are awake. Take your time and take a few normal breaths between deep breaths. 9. The spirometer may include an indicator to show your best effort. Use the indicator as a goal to work toward during each repetition. 10. After each set of 10 deep breaths, practice coughing to be sure your lungs are clear. If you have an incision (the cut made at the time of surgery), support your incision when coughing by placing a pillow or rolled up towels firmly against it. Once you are able to get out of bed, walk around indoors and cough well. You may stop using the incentive spirometer when instructed by your caregiver.  RISKS AND COMPLICATIONS  Take your time  so you do not get dizzy or light-headed.  If you are in pain, you may need to take or ask for pain medication before doing incentive spirometry.  It is harder to take a deep breath if you are having pain. AFTER USE  Rest and breathe slowly and easily.  It can be helpful to keep track of a log of your progress. Your caregiver can provide you with a simple table to help with this. If you are using the spirometer at home, follow these instructions: [place] IF:   You are having difficultly using the spirometer.  You have trouble using the spirometer as often as instructed.  Your pain medication is not giving enough relief while using the spirometer.  You develop fever of 100.5 F (38.1 C) or higher. SEEK IMMEDIATE MEDICAL CARE IF:   You cough up bloody sputum that had not been present before.  You develop fever of 102 F (38.9 C) or greater.  You develop worsening pain at or near the incision site. MAKE SURE YOU:   Understand these instructions.  Will watch your condition.  Will get help right away if you are not doing well or get worse. Document Released: 12/18/2006 Document Revised: 10/30/2011 Document Reviewed: 02/18/2007 ExitCare Patient Information 2014 ExitCare, [place].   ________________________________________________________________________  WHAT IS A BLOOD TRANSFUSION? Blood Transfusion Information  A transfusion is the replacement of blood or some of its parts. Blood is made up of multiple cells which provide different functions.  Red blood cells carry oxygen and are used for blood loss replacement.  White blood cells fight against infection.  Platelets control bleeding.  Plasma helps clot blood.  Other blood products are available for specialized needs, such as hemophilia or other clotting disorders. BEFORE THE TRANSFUSION  Who gives blood for transfusions?   Healthy volunteers who are fully evaluated to make sure their blood is safe. This is blood bank blood. Transfusion therapy is the safest it has ever been in the practice of medicine. Before blood is taken from a donor, a complete  history is taken to make sure that person has no history of diseases nor engages in risky social behavior (examples are intravenous drug use or sexual activity with multiple partners). The donor's travel history is screened to minimize risk of transmitting infections, such as malaria. The donated blood is tested for signs of infectious diseases, such as HIV and hepatitis. The blood is then tested to be sure it is compatible with you in order to minimize the chance of a transfusion reaction. If you or a relative donates blood, this is often done in anticipation of surgery and is not appropriate for emergency situations. It takes many days to process the donated blood. RISKS AND COMPLICATIONS Although transfusion therapy is very safe and saves many lives, the main dangers of transfusion include:   Getting an infectious disease.  Developing a transfusion reaction. This is an allergic reaction to something in the blood you were given. Every precaution is taken to prevent this. The decision to have a blood transfusion has been considered carefully by your caregiver before blood is given. Blood is not given unless the benefits outweigh the risks. AFTER THE TRANSFUSION  Right after receiving a blood transfusion, you will usually feel much better and more energetic. This is especially true if your red blood cells have gotten low (anemic). The transfusion raises the level of the red blood cells which carry oxygen, and this usually causes an energy increase.  The  nurse administering the transfusion will monitor you carefully for complications. HOME CARE INSTRUCTIONS  No special instructions are needed after a transfusion. You may find your energy is better. Speak with your caregiver about any limitations on activity for underlying diseases you may have. SEEK MEDICAL CARE IF:   Your condition is not improving after your transfusion.  You develop redness or irritation at the intravenous (IV) site. SEEK  IMMEDIATE MEDICAL CARE IF:  Any of the following symptoms occur over the next 12 hours:  Shaking chills.  You have a temperature by mouth above 102 F (38.9 C), not controlled by medicine.  Chest, back, or muscle pain.  People around you feel you are not acting correctly or are confused.  Shortness of breath or difficulty breathing.  Dizziness and fainting.  You get a rash or develop hives.  You have a decrease in urine output.  Your urine turns a dark color or changes to pink, red, or brown. Any of the following symptoms occur over the next 10 days:  You have a temperature by mouth above 102 F (38.9 C), not controlled by medicine.  Shortness of breath.  Weakness after normal activity.  The white part of the eye turns yellow (jaundice).  You have a decrease in the amount of urine or are urinating less often.  Your urine turns a dark color or changes to pink, red, or brown. Document Released: 08/04/2000 Document Revised: 10/30/2011 Document Reviewed: 03/23/2008 [employer] Patient Information 2014 [place], [place].  _______________________________________________________________________

## 2017-11-15 ENCOUNTER — Encounter (HOSPITAL_COMMUNITY)
Admission: RE | Admit: 2017-11-15 | Discharge: 2017-11-15 | Disposition: A | Discharge status: Home / Self Care | Payer: Medicare Other | Source: Ambulatory Visit | Attending: Urology | Admitting: Urology

## 2017-11-15 ENCOUNTER — Encounter (HOSPITAL_COMMUNITY): Payer: Medicare Other

## 2017-11-15 ENCOUNTER — Encounter (HOSPITAL_COMMUNITY): Payer: Self-pay

## 2017-11-15 ENCOUNTER — Other Ambulatory Visit: Payer: Self-pay

## 2017-11-15 DIAGNOSIS — Z0183 Encounter for blood typing: Secondary | ICD-10-CM | POA: Diagnosis not present

## 2017-11-15 DIAGNOSIS — Z01812 Encounter for preprocedural laboratory examination: Secondary | ICD-10-CM | POA: Insufficient documentation

## 2017-11-15 DIAGNOSIS — C61 Malignant neoplasm of prostate: Secondary | ICD-10-CM | POA: Insufficient documentation

## 2017-11-15 HISTORY — DX: Malignant neoplasm of prostate: C61

## 2017-11-15 HISTORY — DX: Other cervical disc degeneration, unspecified cervical region: M50.30

## 2017-11-15 LAB — BASIC METABOLIC PANEL
Anion gap: 8 (ref 5–15)
BUN: 19 mg/dL (ref 6–20)
CO2: 26 mmol/L (ref 22–32)
Calcium: 9.5 mg/dL (ref 8.9–10.3)
Chloride: 106 mmol/L (ref 101–111)
Creatinine, Ser: 1.02 mg/dL (ref 0.61–1.24)
GFR calc Af Amer: >60 mL/min (ref >60)
GFR calc non Af Amer: >60 mL/min (ref >60)
Glucose, Bld: 116 mg/dL — ABNORMAL HIGH (ref 65–99)
Potassium: 4.7 mmol/L (ref 3.5–5.1)
Sodium: 140 mmol/L (ref 135–145)

## 2017-11-15 LAB — CBC
HCT: 42.6 % (ref 39.0–52.0)
Hemoglobin: 14.9 g/dL (ref 13.0–17.0)
MCH: 28 pg (ref 26.0–34.0)
MCHC: 35 g/dL (ref 30.0–36.0)
MCV: 79.9 fL (ref 78.0–100.0)
Platelets: 282 10*3/uL (ref 150–400)
RBC: 5.33 MIL/uL (ref 4.22–5.81)
RDW: 14.4 % (ref 11.5–15.5)
WBC: 5.5 10*3/uL (ref 4.0–10.5)

## 2017-11-15 MED ORDER — MAGNESIUM CITRATE PO SOLN
1.0000 | Freq: Once | ORAL | Status: DC
  Filled 2017-11-15: qty 296

## 2017-11-15 MED ORDER — FLEET ENEMA 7-19 GM/118ML RE ENEM
1.0000 | ENEMA | Freq: Once | RECTAL | Status: DC
  Filled 2017-11-15: qty 1

## 2017-11-15 NOTE — Pre-Procedure Instructions (Signed)
BMP results 11/15/2017 faxed to Dr. Alinda Money via epic.

## 2017-11-21 NOTE — Anesthesia Preprocedure Evaluation (Addendum)
Anesthesia Evaluation  Patient identified by MRN, date of birth, ID band Patient awake    Reviewed: Allergy & Precautions, H&P , NPO status , Patient's Chart, lab work & pertinent test results  Airway Mallampati: III  TM Distance: >3 FB Neck ROM: Full    Dental no notable dental hx. (+) Teeth Intact   Pulmonary sleep apnea ,  Non compliant with CPAP   Pulmonary exam normal breath sounds clear to auscultation       Cardiovascular hypertension, Pt. 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   Abdominal (+) + obese,   Peds  Hematology negative hematology ROS (+)   Anesthesia Other Findings   Reproductive/Obstetrics                            Anesthesia Physical Anesthesia Plan  ASA: II  Anesthesia Plan: General   Post-op Pain Management:    Induction: Intravenous  PONV Risk Score and Plan: 2 and Treatment may vary due to age or medical condition, Ondansetron, Dexamethasone and Midazolam  Airway Management Planned: Oral ETT  Additional Equipment:   Intra-op Plan:   Post-operative Plan: Extubation in OR  Informed Consent: I have reviewed the patients History and Physical, chart, labs and discussed the procedure including the risks, benefits and alternatives for the proposed anesthesia with the patient or authorized representative who has indicated his/her understanding and acceptance.   Dental advisory given  Plan Discussed with: CRNA, Anesthesiologist and Surgeon  Anesthesia Plan Comments:        Anesthesia Quick Evaluation

## 2017-11-21 NOTE — H&P (Signed)
CC/HPI: CC: Prostate Cancer     Thomas Barnes is a 72 year old gentleman with a history of an elevated PSA s/p a negative biopsy in October 2017. His PSA further increased to 7.51 prompting a prostate MRI that demonstrated a 1.8 cm PI-RADS 5 lesion at the right anterior mid/base laterally. A repeat TRUS biopsy of the prostate was performed on 09/27/17 with cognitive fusion and additional cores of the transition zone and areas of concern on MRI. 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   Family history: Father died of metastatic disease.   Imaging studies:  MRI (08/29/17): No EPE, SVI, or LAD.  Bone scan (PENDING)   PMH: Thomas Barnes has a history of sleep apnea, GERD, hypertension, and hyperlipidemia.  PSH: No abdominal surgeries.   TNM stage: cT1c N0 Mx  PSA: 7.51  Gleason score: 4+4=8  Biopsy (09/27/17): 6/12 cores positive  Left: L mid (5%, 3+3=6)  Right: R mid (< 5%, 4+4=8)  MRI ROI: 3/4 cores (5%, 5%, 30%, 4+4=8, PNI)  Prostate volume: 63.9 cc   Nomogram  OC disease: 35%  EPE: 62%  SVI: 6%  LNI: 8%  PFS (5 year, 10 year): 59%, 43%   Urinary function: IPSS is 3.  Erectile function: SHIM score is 2.     ALLERGIES: Penicillin    MEDICATIONS: Allopurinol 300 mg tablet  Lisinopril 20 mg tablet  Simvastatin 40 mg tablet  Aspirin Ec 81 mg tablet, delayed release  Fish Oil 1,000 mg (120 mg-180 mg) capsule     GU PSH: Prostate Needle Biopsy - 09/27/2017    NON-GU PSH: Hip Replacement, Left Tonsillectomy.. - 1953    GU PMH: Elevated PSA - 04/18/2016      PMH Notes:   1) Elevated PSA: Thomas Barnes presented in August 2017 with a PSA of 3.6 and induration of the right medial base of the prostate.   Oct 2017: 12 core biopsy - Benign (performed in OR)   NON-GU PMH: Gout Hypercholesterolemia Hypertension Sleep Apnea    FAMILY HISTORY:  ovarian cancer - Mother prostate cancer in father - Father   SOCIAL HISTORY: Marital Status: Married Preferred Language: English; Ethnicity: Not Hispanic Or Latino; Race: White Current Smoking Status: Patient has never smoked.  Has never drank.  Does not drink caffeine.    REVIEW OF SYSTEMS:    GU Review Male:   Patient denies frequent urination, hard to postpone urination, burning/ pain with urination, get up at night to urinate, leakage of urine, stream starts and stops, trouble starting your streams, and have to strain to urinate .  Gastrointestinal (Upper):   Patient denies nausea and vomiting.  Gastrointestinal (Lower):   Patient denies diarrhea and constipation.  Constitutional:   Patient denies fever, night sweats, weight loss, and fatigue.  Skin:   Patient denies skin rash/ lesion and itching.  Eyes:   Patient denies blurred vision and double vision.  Ears/ Nose/ Throat:   Patient denies sore throat and sinus problems.  Hematologic/Lymphatic:   Patient denies swollen glands and easy bruising.  Cardiovascular:   Patient denies leg swelling and chest pains.  Respiratory:   Patient denies cough and shortness of breath.  Endocrine:   Patient denies excessive thirst.  Musculoskeletal:   Patient denies back pain and joint pain.  Neurological:   Patient denies headaches and dizziness.  Psychologic:  Patient denies depression and anxiety.   VITAL SIGNS:     Weight 220 lb / 99.79 kg  Height 70 in / 177.8 cm  BMI 31.6 kg/m   MULTI-SYSTEM PHYSICAL EXAMINATION:    Constitutional: Well-nourished. No physical deformities. Normally developed. Good grooming.  Neck: Neck symmetrical, not swollen. Normal tracheal position.  Respiratory: No labored breathing, no use of accessory muscles. Clear bilaterally.  Cardiovascular: Normal temperature, normal extremity pulses, no swelling, no varicosities. Regular rate and rhythm.  Lymphatic: No enlargement of neck, axillae, groin.  Skin: No  paleness, no jaundice, no cyanosis. No lesion, no ulcer, no rash.  Neurologic / Psychiatric: Oriented to time, oriented to place, oriented to person. No depression, no anxiety, no agitation.  Gastrointestinal: Soft, nontender, nondistended. Mildly obese.  Eyes: Normal conjunctivae. Normal eyelids.  Ears, Nose, Mouth, and Throat: Left ear no scars, no lesions, no masses. Right ear no scars, no lesions, no masses. Nose no scars, no lesions, no masses. Normal hearing. Normal lips.  Musculoskeletal: Normal gait and station of head and neck.       ASSESSMENT:      ICD-10 Details  1 GU:   Prostate Cancer - C61    PLAN:          1. High-risk prostate cancer: Thomas Barnes has elected to proceed with surgical treatment. Thomas Barnes will be scheduled for a robot-assisted laparoscopic radical prostatectomy and pelvic lymphadenectomy.

## 2017-11-22 ENCOUNTER — Ambulatory Visit (HOSPITAL_COMMUNITY): Payer: Medicare Other | Admitting: Anesthesiology

## 2017-11-22 ENCOUNTER — Encounter (HOSPITAL_COMMUNITY): Payer: Self-pay | Admitting: *Deleted

## 2017-11-22 ENCOUNTER — Other Ambulatory Visit: Payer: Self-pay

## 2017-11-22 ENCOUNTER — Encounter (HOSPITAL_COMMUNITY)
Admission: RE | Disposition: A | Discharge status: Home / Self Care | Payer: Self-pay | Source: Ambulatory Visit | Attending: Urology

## 2017-11-22 ENCOUNTER — Observation Stay (HOSPITAL_COMMUNITY)
Admission: RE | Admit: 2017-11-22 | Discharge: 2017-11-23 | Disposition: A | Discharge status: Home / Self Care | Payer: Medicare Other | Source: Ambulatory Visit | Attending: Urology | Admitting: Urology

## 2017-11-22 DIAGNOSIS — K219 Gastro-esophageal reflux disease without esophagitis: Secondary | ICD-10-CM | POA: Insufficient documentation

## 2017-11-22 DIAGNOSIS — I10 Essential (primary) hypertension: Secondary | ICD-10-CM | POA: Insufficient documentation

## 2017-11-22 DIAGNOSIS — Z8042 Family history of malignant neoplasm of prostate: Secondary | ICD-10-CM | POA: Diagnosis not present

## 2017-11-22 DIAGNOSIS — E669 Obesity, unspecified: Secondary | ICD-10-CM | POA: Insufficient documentation

## 2017-11-22 DIAGNOSIS — E7800 Pure hypercholesterolemia, unspecified: Secondary | ICD-10-CM | POA: Diagnosis not present

## 2017-11-22 DIAGNOSIS — Z6832 Body mass index (BMI) 32.0-32.9, adult: Secondary | ICD-10-CM | POA: Diagnosis not present

## 2017-11-22 DIAGNOSIS — Z7982 Long term (current) use of aspirin: Secondary | ICD-10-CM | POA: Insufficient documentation

## 2017-11-22 DIAGNOSIS — Z79899 Other long term (current) drug therapy: Secondary | ICD-10-CM | POA: Diagnosis not present

## 2017-11-22 DIAGNOSIS — C61 Malignant neoplasm of prostate: Principal | ICD-10-CM | POA: Insufficient documentation

## 2017-11-22 DIAGNOSIS — Z9119 Patient's noncompliance with other medical treatment and regimen: Secondary | ICD-10-CM | POA: Diagnosis not present

## 2017-11-22 DIAGNOSIS — G4730 Sleep apnea, unspecified: Secondary | ICD-10-CM | POA: Insufficient documentation

## 2017-11-22 DIAGNOSIS — Z880 Allergy status to penicillin: Secondary | ICD-10-CM | POA: Insufficient documentation

## 2017-11-22 DIAGNOSIS — M1990 Unspecified osteoarthritis, unspecified site: Secondary | ICD-10-CM | POA: Diagnosis not present

## 2017-11-22 HISTORY — PX: ROBOT ASSISTED LAPAROSCOPIC RADICAL PROSTATECTOMY: SHX5141

## 2017-11-22 HISTORY — PX: LYMPHADENECTOMY: SHX5960

## 2017-11-22 LAB — TYPE AND SCREEN
ABO/RH(D): A POS
Antibody Screen: NEGATIVE

## 2017-11-22 LAB — HEMOGLOBIN AND HEMATOCRIT, BLOOD
HCT: 37.3 % — ABNORMAL LOW (ref 39.0–52.0)
Hemoglobin: 12.7 g/dL — ABNORMAL LOW (ref 13.0–17.0)

## 2017-11-22 SURGERY — XI ROBOTIC ASSISTED LAPAROSCOPIC RADICAL PROSTATECTOMY LEVEL 2
Anesthesia: General

## 2017-11-22 MED ORDER — ACETAMINOPHEN 10 MG/ML IV SOLN
INTRAVENOUS | Status: AC
  Filled 2017-11-22: qty 100

## 2017-11-22 MED ORDER — PROPOFOL 10 MG/ML IV BOLUS
INTRAVENOUS | Status: AC
  Filled 2017-11-22: qty 20

## 2017-11-22 MED ORDER — FENTANYL CITRATE (PF) 100 MCG/2ML IJ SOLN
INTRAMUSCULAR | Status: AC
Start: 2017-11-22 — End: ?
  Filled 2017-11-22: qty 2

## 2017-11-22 MED ORDER — PROMETHAZINE HCL 25 MG/ML IJ SOLN: 6.2500 mg | INTRAMUSCULAR | Status: DC | PRN

## 2017-11-22 MED ORDER — CEFAZOLIN SODIUM-DEXTROSE 1-4 GM/50ML-% IV SOLN
1.0000 g | Freq: Three times a day (TID) | INTRAVENOUS | Status: AC
  Administered 2017-11-22 – 2017-11-23 (×2): 1 g via INTRAVENOUS
  Filled 2017-11-22 (×2): qty 50

## 2017-11-22 MED ORDER — LIDOCAINE 2% (20 MG/ML) 5 ML SYRINGE
INTRAMUSCULAR | Status: DC | PRN
  Administered 2017-11-22: 80 mg via INTRAVENOUS

## 2017-11-22 MED ORDER — ONDANSETRON HCL 4 MG/2ML IJ SOLN
INTRAMUSCULAR | Status: AC
  Filled 2017-11-22: qty 2

## 2017-11-22 MED ORDER — FENTANYL CITRATE (PF) 250 MCG/5ML IJ SOLN
INTRAMUSCULAR | Status: AC
  Filled 2017-11-22: qty 5

## 2017-11-22 MED ORDER — LISINOPRIL 10 MG PO TABS
10.0000 mg | ORAL_TABLET | Freq: Every day | ORAL | Status: DC
  Administered 2017-11-22 – 2017-11-23 (×2): 10 mg via ORAL
  Filled 2017-11-22 (×2): qty 1

## 2017-11-22 MED ORDER — ACETAMINOPHEN 10 MG/ML IV SOLN
1000.0000 mg | Freq: Once | INTRAVENOUS | Status: DC | PRN
  Administered 2017-11-22: 1000 mg via INTRAVENOUS

## 2017-11-22 MED ORDER — HYDROMORPHONE HCL 1 MG/ML IJ SOLN
0.2500 mg | INTRAMUSCULAR | Status: DC | PRN
  Administered 2017-11-22 (×4): 0.5 mg via INTRAVENOUS

## 2017-11-22 MED ORDER — SUGAMMADEX SODIUM 200 MG/2ML IV SOLN
INTRAVENOUS | Status: DC | PRN
  Administered 2017-11-22: 200 mg via INTRAVENOUS

## 2017-11-22 MED ORDER — MORPHINE SULFATE (PF) 4 MG/ML IV SOLN: 2.0000 mg | INTRAVENOUS | Status: DC | PRN

## 2017-11-22 MED ORDER — PROPOFOL 10 MG/ML IV BOLUS
INTRAVENOUS | Status: DC | PRN
  Administered 2017-11-22: 180 mg via INTRAVENOUS

## 2017-11-22 MED ORDER — ROCURONIUM BROMIDE 10 MG/ML (PF) SYRINGE
PREFILLED_SYRINGE | INTRAVENOUS | Status: AC
  Filled 2017-11-22: qty 5

## 2017-11-22 MED ORDER — FENTANYL CITRATE (PF) 250 MCG/5ML IJ SOLN
INTRAMUSCULAR | Status: DC | PRN
  Administered 2017-11-22 (×2): 50 ug via INTRAVENOUS
  Administered 2017-11-22: 100 ug via INTRAVENOUS
  Administered 2017-11-22 (×2): 50 ug via INTRAVENOUS

## 2017-11-22 MED ORDER — MEPERIDINE HCL 50 MG/ML IJ SOLN: 6.2500 mg | INTRAMUSCULAR | Status: DC | PRN

## 2017-11-22 MED ORDER — DIPHENHYDRAMINE HCL 50 MG/ML IJ SOLN: 12.5000 mg | Freq: Four times a day (QID) | INTRAMUSCULAR | Status: DC | PRN

## 2017-11-22 MED ORDER — HYDROMORPHONE HCL 1 MG/ML IJ SOLN: 0.2500 mg | INTRAMUSCULAR | Status: DC | PRN

## 2017-11-22 MED ORDER — LIDOCAINE 2% (20 MG/ML) 5 ML SYRINGE
INTRAMUSCULAR | Status: AC
  Filled 2017-11-22: qty 5

## 2017-11-22 MED ORDER — DIPHENHYDRAMINE HCL 12.5 MG/5ML PO ELIX: 12.5000 mg | ORAL_SOLUTION | Freq: Four times a day (QID) | ORAL | Status: DC | PRN

## 2017-11-22 MED ORDER — MIDAZOLAM HCL 2 MG/2ML IJ SOLN
INTRAMUSCULAR | Status: AC
  Filled 2017-11-22: qty 2

## 2017-11-22 MED ORDER — EPHEDRINE SULFATE-NACL 50-0.9 MG/10ML-% IV SOSY
PREFILLED_SYRINGE | INTRAVENOUS | Status: DC | PRN
  Administered 2017-11-22: 5 mg via INTRAVENOUS

## 2017-11-22 MED ORDER — POLYVINYL ALCOHOL 1.4 % OP SOLN
1.0000 [drp] | Freq: Four times a day (QID) | OPHTHALMIC | Status: DC | PRN
  Administered 2017-11-22: 1 [drp] via OPHTHALMIC
  Filled 2017-11-22: qty 15

## 2017-11-22 MED ORDER — BUPIVACAINE-EPINEPHRINE (PF) 0.5% -1:200000 IJ SOLN
INTRAMUSCULAR | Status: AC
  Filled 2017-11-22: qty 30

## 2017-11-22 MED ORDER — KCL IN DEXTROSE-NACL 20-5-0.45 MEQ/L-%-% IV SOLN
INTRAVENOUS | Status: DC
  Administered 2017-11-22 – 2017-11-23 (×3): via INTRAVENOUS
  Filled 2017-11-22 (×3): qty 1000

## 2017-11-22 MED ORDER — SODIUM CHLORIDE 0.9 % IV BOLUS
1000.0000 mL | Freq: Once | INTRAVENOUS | Status: AC
  Administered 2017-11-22: 1000 mL via INTRAVENOUS

## 2017-11-22 MED ORDER — MIDAZOLAM HCL 5 MG/5ML IJ SOLN
INTRAMUSCULAR | Status: DC | PRN
  Administered 2017-11-22: 2 mg via INTRAVENOUS

## 2017-11-22 MED ORDER — CEFAZOLIN SODIUM-DEXTROSE 2-4 GM/100ML-% IV SOLN
INTRAVENOUS | Status: AC
  Filled 2017-11-22: qty 100

## 2017-11-22 MED ORDER — SODIUM CHLORIDE 0.9 % IR SOLN
Status: DC | PRN
  Administered 2017-11-22: 1000 mL via INTRAVESICAL

## 2017-11-22 MED ORDER — HYDROCODONE-ACETAMINOPHEN 5-325 MG PO TABS: 1.0000 | ORAL_TABLET | Freq: Four times a day (QID) | ORAL | 0 refills | Status: DC | PRN

## 2017-11-22 MED ORDER — DEXAMETHASONE SODIUM PHOSPHATE 10 MG/ML IJ SOLN
INTRAMUSCULAR | Status: DC | PRN
  Administered 2017-11-22: 10 mg via INTRAVENOUS

## 2017-11-22 MED ORDER — HYDROMORPHONE HCL 1 MG/ML IJ SOLN
INTRAMUSCULAR | Status: AC
  Filled 2017-11-22: qty 2

## 2017-11-22 MED ORDER — DEXAMETHASONE SODIUM PHOSPHATE 10 MG/ML IJ SOLN
INTRAMUSCULAR | Status: AC
  Filled 2017-11-22: qty 1

## 2017-11-22 MED ORDER — HEPARIN SODIUM (PORCINE) 1000 UNIT/ML IJ SOLN
INTRAMUSCULAR | Status: AC
  Filled 2017-11-22: qty 1

## 2017-11-22 MED ORDER — ACETAMINOPHEN 325 MG PO TABS: 650.0000 mg | ORAL_TABLET | ORAL | Status: DC | PRN

## 2017-11-22 MED ORDER — HYDROCODONE-ACETAMINOPHEN 7.5-325 MG PO TABS: 1.0000 | ORAL_TABLET | Freq: Once | ORAL | Status: DC | PRN

## 2017-11-22 MED ORDER — ALLOPURINOL 300 MG PO TABS
300.0000 mg | ORAL_TABLET | Freq: Every evening | ORAL | Status: DC
  Administered 2017-11-22: 300 mg via ORAL
  Filled 2017-11-22: qty 1

## 2017-11-22 MED ORDER — BUPIVACAINE-EPINEPHRINE 0.5% -1:200000 IJ SOLN
INTRAMUSCULAR | Status: DC | PRN
  Administered 2017-11-22: 30 mL

## 2017-11-22 MED ORDER — LACTATED RINGERS IV SOLN
INTRAVENOUS | Status: DC
  Administered 2017-11-22 (×2): via INTRAVENOUS

## 2017-11-22 MED ORDER — CEFAZOLIN SODIUM-DEXTROSE 2-4 GM/100ML-% IV SOLN
2.0000 g | Freq: Once | INTRAVENOUS | Status: AC
  Administered 2017-11-22: 2 g via INTRAVENOUS

## 2017-11-22 MED ORDER — KETOROLAC TROMETHAMINE 15 MG/ML IJ SOLN
15.0000 mg | Freq: Four times a day (QID) | INTRAMUSCULAR | Status: DC
  Administered 2017-11-22 – 2017-11-23 (×3): 15 mg via INTRAVENOUS
  Filled 2017-11-22 (×3): qty 1

## 2017-11-22 MED ORDER — DOCUSATE SODIUM 100 MG PO CAPS
100.0000 mg | ORAL_CAPSULE | Freq: Two times a day (BID) | ORAL | Status: DC
  Administered 2017-11-22 – 2017-11-23 (×2): 100 mg via ORAL
  Filled 2017-11-22 (×2): qty 1

## 2017-11-22 MED ORDER — SUGAMMADEX SODIUM 200 MG/2ML IV SOLN
INTRAVENOUS | Status: AC
  Filled 2017-11-22: qty 2

## 2017-11-22 MED ORDER — SIMVASTATIN 40 MG PO TABS
40.0000 mg | ORAL_TABLET | Freq: Every day | ORAL | Status: DC
  Administered 2017-11-22 – 2017-11-23 (×2): 40 mg via ORAL
  Filled 2017-11-22 (×2): qty 1

## 2017-11-22 MED ORDER — ROCURONIUM BROMIDE 10 MG/ML (PF) SYRINGE
PREFILLED_SYRINGE | INTRAVENOUS | Status: DC | PRN
  Administered 2017-11-22: 50 mg via INTRAVENOUS
  Administered 2017-11-22 (×3): 10 mg via INTRAVENOUS
  Administered 2017-11-22: 30 mg via INTRAVENOUS

## 2017-11-22 MED ORDER — HEPARIN SODIUM (PORCINE) 1000 UNIT/ML IJ SOLN
INTRAMUSCULAR | Status: DC | PRN
  Administered 2017-11-22: 1000 mL

## 2017-11-22 MED ORDER — SULFAMETHOXAZOLE-TRIMETHOPRIM 800-160 MG PO TABS: 1.0000 | ORAL_TABLET | Freq: Two times a day (BID) | ORAL | 0 refills | Status: DC

## 2017-11-22 SURGICAL SUPPLY — 55 items
APPLICATOR COTTON TIP 6IN STRL (MISCELLANEOUS) ×4 IMPLANT
CATH FOLEY 2WAY SLVR 18FR 30CC (CATHETERS) ×4 IMPLANT
CATH ROBINSON RED A/P 16FR (CATHETERS) ×4 IMPLANT
CATH ROBINSON RED A/P 8FR (CATHETERS) ×4 IMPLANT
CATH TIEMANN FOLEY 18FR 5CC (CATHETERS) ×4 IMPLANT
CHLORAPREP W/TINT 26ML (MISCELLANEOUS) ×4 IMPLANT
CLIP VESOLOCK LG 6/CT PURPLE (CLIP) ×8 IMPLANT
COVER SURGICAL LIGHT HANDLE (MISCELLANEOUS) ×4 IMPLANT
COVER TIP SHEARS 8 DVNC (MISCELLANEOUS) ×2 IMPLANT
COVER TIP SHEARS 8MM DA VINCI (MISCELLANEOUS) ×2
CUTTER ECHEON FLEX ENDO 45 340 (ENDOMECHANICALS) ×4 IMPLANT
DECANTER SPIKE VIAL GLASS SM (MISCELLANEOUS) IMPLANT
DERMABOND ADVANCED (GAUZE/BANDAGES/DRESSINGS)
DERMABOND ADVANCED .7 DNX12 (GAUZE/BANDAGES/DRESSINGS) IMPLANT
DRAPE ARM DVNC X/XI (DISPOSABLE) ×8 IMPLANT
DRAPE COLUMN DVNC XI (DISPOSABLE) ×2 IMPLANT
DRAPE DA VINCI XI ARM (DISPOSABLE) ×8
DRAPE DA VINCI XI COLUMN (DISPOSABLE) ×2
DRAPE SURG IRRIG POUCH 19X23 (DRAPES) ×4 IMPLANT
DRSG TEGADERM 4X4.75 (GAUZE/BANDAGES/DRESSINGS) ×4 IMPLANT
ELECT REM PT RETURN 15FT ADLT (MISCELLANEOUS) ×4 IMPLANT
GLOVE BIO SURGEON STRL SZ 6.5 (GLOVE) ×3 IMPLANT
GLOVE BIO SURGEONS STRL SZ 6.5 (GLOVE) ×1
GLOVE BIOGEL M STRL SZ7.5 (GLOVE) ×8 IMPLANT
GOWN STRL REUS W/TWL LRG LVL3 (GOWN DISPOSABLE) ×12 IMPLANT
HOLDER FOLEY CATH W/STRAP (MISCELLANEOUS) ×4 IMPLANT
IRRIG SUCT STRYKERFLOW 2 WTIP (MISCELLANEOUS) ×4
IRRIGATION SUCT STRKRFLW 2 WTP (MISCELLANEOUS) ×2 IMPLANT
IV LACTATED RINGERS 1000ML (IV SOLUTION) IMPLANT
NDL SAFETY ECLIPSE 18X1.5 (NEEDLE) ×2 IMPLANT
NEEDLE HYPO 18GX1.5 SHARP (NEEDLE) ×2
PACK ROBOT UROLOGY CUSTOM (CUSTOM PROCEDURE TRAY) ×4 IMPLANT
SEAL CANN UNIV 5-8 DVNC XI (MISCELLANEOUS) ×8 IMPLANT
SEAL XI 5MM-8MM UNIVERSAL (MISCELLANEOUS) ×8
SOLUTION ELECTROLUBE (MISCELLANEOUS) ×4 IMPLANT
STAPLE RELOAD 45 GRN (STAPLE) ×2 IMPLANT
STAPLE RELOAD 45MM GREEN (STAPLE) ×2
SUT ETHILON 3 0 PS 1 (SUTURE) ×4 IMPLANT
SUT MNCRL 3 0 RB1 (SUTURE) ×2 IMPLANT
SUT MNCRL 3 0 VIOLET RB1 (SUTURE) ×2 IMPLANT
SUT MNCRL AB 4-0 PS2 18 (SUTURE) ×8 IMPLANT
SUT MONOCRYL 3 0 RB1 (SUTURE) ×4
SUT VIC AB 0 CT1 27 (SUTURE) ×2
SUT VIC AB 0 CT1 27XBRD ANTBC (SUTURE) ×2 IMPLANT
SUT VIC AB 0 UR5 27 (SUTURE) ×4 IMPLANT
SUT VIC AB 2-0 SH 27 (SUTURE) ×2
SUT VIC AB 2-0 SH 27X BRD (SUTURE) ×2 IMPLANT
SUT VIC AB 3-0 SH 27 (SUTURE) ×2
SUT VIC AB 3-0 SH 27XBRD (SUTURE) ×2 IMPLANT
SUT VICRYL 0 UR6 27IN ABS (SUTURE) ×8 IMPLANT
SYR 27GX1/2 1ML LL SAFETY (SYRINGE) ×4 IMPLANT
TOWEL OR 17X26 10 PK STRL BLUE (TOWEL DISPOSABLE) IMPLANT
TOWEL OR NON WOVEN STRL DISP B (DISPOSABLE) ×4 IMPLANT
TUBING INSUFFLATION 10FT LAP (TUBING) IMPLANT
WATER STERILE IRR 1000ML POUR (IV SOLUTION) IMPLANT

## 2017-11-22 NOTE — Discharge Instructions (Signed)

## 2017-11-22 NOTE — Progress Notes (Signed)
Patient ID: Thomas Barnes, male   DOB: 02-03-1946, 72 y.o.   MRN: [id number]  Post-op note  Subjective: The patient is doing well.  Complains of right eye pain.  Objective: Vital signs in last 24 hours: Temp:  [97.6 F (36.4 C)-98.1 F (36.7 C)] 97.6 F (36.4 C) (04/04 1509) Pulse Rate:  [65-95] 91 (04/04 1509) Resp:  [7-18] 16 (04/04 1509) BP: (135-163)/(84-103) 157/94 (04/04 1509) SpO2:  [92 %-100 %] 98 % (04/04 1509) Weight:  [102.1 kg (225 lb)] 102.1 kg (225 lb) (04/04 0951)  Intake/Output from previous day: No intake/output data recorded. Intake/Output this shift: Total I/O In: 1800 [P.O.:240; I.V.:1460; IV Piggyback:100] Out: 295 [Urine:45; Drains:50; Blood:200]  Physical Exam:  General: Alert and oriented. Abdomen: Soft, Nondistended. Incisions: Clean and dry.  Lab Results: Recent Labs    11/22/17 1434  HGB 12.7*  HCT 37.3*    Assessment/Plan: POD#0   1) Continue to monitor, IS ,ambulate 2) Possible corneal abrasion - artificial tears and monitor   Pryor Curia. MD   LOS: 0 days   Geralynn Capri,LES 11/22/2017, 5:11 PM

## 2017-11-22 NOTE — Anesthesia Procedure Notes (Signed)
Procedure Name: Intubation Date/Time: 11/22/2017 11:03 AM Performed by: Mitzie Na, CRNA Pre-anesthesia Checklist: Patient identified, Emergency Drugs available, Suction available, Patient being monitored and Timeout performed Patient Re-evaluated:Patient Re-evaluated prior to induction Oxygen Delivery Method: Circle system utilized Preoxygenation: Pre-oxygenation with 100% oxygen Induction Type: IV induction Ventilation: Two handed mask ventilation required Laryngoscope Size: Mac and 3 Grade View: Grade II Tube type: Oral Tube size: 7.5 mm Number of attempts: 1 Airway Equipment and Method: Stylet and Oral airway Placement Confirmation: ETT inserted through vocal cords under direct vision,  positive ETCO2 and breath sounds checked- equal and bilateral Secured at: 23 cm Tube secured with: Tape Dental Injury: Teeth and Oropharynx as per pre-operative assessment

## 2017-11-22 NOTE — Progress Notes (Signed)
RT talked with patient about wearing a CPAP. Patient stated that he does not wear one.

## 2017-11-22 NOTE — Transfer of Care (Signed)
Immediate Anesthesia Transfer of Care Note  Patient: Thomas Barnes  Procedure(s) Performed: XI ROBOTIC ASSISTED LAPAROSCOPIC RADICAL PROSTATECTOMY LEVEL 2 (N/A ) LYMPHADENECTOMY, PELVIC (Bilateral )  Patient Location: PACU  Anesthesia Type:General  Level of Consciousness: drowsy, patient cooperative and lethargic  Airway & Oxygen Therapy: Patient Spontanous Breathing and Patient connected to face mask oxygen  Post-op Assessment: Report given to RN, Post -op Vital signs reviewed and stable and Patient moving all extremities  Post vital signs: Reviewed and stable  Last Vitals:  Vitals Value Taken Time  BP 158/91 11/22/2017  2:08 PM  Temp 36.7 C 11/22/2017  2:08 PM  Pulse 94 11/22/2017  2:12 PM  Resp 11 11/22/2017  2:12 PM  SpO2 96 % 11/22/2017  2:12 PM  Vitals shown include unvalidated device data.  Last Pain:  Vitals:   11/22/17 1408  TempSrc:   PainSc: (P) 6          Complications: No apparent anesthesia complications

## 2017-11-22 NOTE — Op Note (Signed)
Preoperative diagnosis: Clinically localized adenocarcinoma of the prostate (clinical stage T1c N0 M0)  Postoperative diagnosis: Clinically localized adenocarcinoma of the prostate (clinical stage T1c N0 M0)  Procedure:  1. Robotic assisted laparoscopic radical prostatectomy (bilateral nerve sparing) 2. Bilateral robotic assisted laparoscopic pelvic lymphadenectomy  Surgeon: Pryor Curia. M.D.  Assistant: Debbrah Alar, PA-C  An assistant was required for this surgical procedure.  The duties of the assistant included but were not limited to suctioning, passing suture, camera manipulation, retraction. This procedure would not be able to be performed without an Environmental consultant.  Anesthesia: General  Complications: None  EBL: 200 mL  IVF:  1400 mL crystalloid  Specimens: 1. Prostate and seminal vesicles 2. Right pelvic lymph nodes 3. Left pelvic lymph nodes  Disposition of specimens: Pathology  Drains: 1. 20 Fr coude catheter 2. # 19 Blake pelvic drain  Indication: Thomas Barnes is a 72 y.o. year old patient with clinically localized prostate cancer.  After a thorough review of the management options for treatment of prostate cancer, he elected to proceed with surgical therapy and the above procedure(s).  We have discussed the potential benefits and risks of the procedure, side effects of the proposed treatment, the likelihood of the patient achieving the goals of the procedure, and any potential problems that might occur during the procedure or recuperation. Informed consent has been obtained.  Description of procedure:  The patient was taken to the operating room and a general anesthetic was administered. He was given preoperative antibiotics, placed in the dorsal lithotomy position, and prepped and draped in the usual sterile fashion. Next a preoperative timeout was performed. A urethral catheter was placed into the bladder and a site was selected near the umbilicus for  placement of the camera port. This was placed using a standard open Hassan technique which allowed entry into the peritoneal cavity under direct vision and without difficulty. An 8 mm robotic port was placed and a pneumoperitoneum established. The camera was then used to inspect the abdomen and there was no evidence of any intra-abdominal injuries or other abnormalities. The remaining abdominal ports were then placed. 8 mm robotic ports were placed in the right lower quadrant, left lower quadrant, and far left lateral abdominal wall. A 5 mm port was placed in the right upper quadrant and a 12 mm port was placed in the right lateral abdominal wall for laparoscopic assistance. All ports were placed under direct vision without difficulty. The surgical cart was then docked.   Utilizing the cautery scissors, the bladder was reflected posteriorly allowing entry into the space of Retzius and identification of the endopelvic fascia and prostate. The periprostatic fat was then removed from the prostate allowing full exposure of the endopelvic fascia. The endopelvic fascia was then incised from the apex back to the base of the prostate bilaterally and the underlying levator muscle fibers were swept laterally off the prostate thereby isolating the dorsal venous complex. The dorsal vein was then stapled and divided with a 45 mm Flex Echelon stapler. Attention then turned to the bladder neck which was divided anteriorly thereby allowing entry into the bladder and exposure of the urethral catheter. The catheter balloon was deflated and the catheter was brought into the operative field and used to retract the prostate anteriorly. The posterior bladder neck was then examined and was divided allowing further dissection between the bladder and prostate posteriorly until the vasa deferentia and seminal vessels were identified. The vasa deferentia were isolated, divided, and lifted anteriorly.  The seminal vesicles were dissected down  to their tips with care to control the seminal vascular arterial blood supply. These structures were then lifted anteriorly and the space between Denonvillier's fascia and the anterior rectum was developed with a combination of sharp and blunt dissection. This isolated the vascular pedicles of the prostate.  The lateral prostatic fascia was then sharply incised allowing release of the neurovascular bundles bilaterally. The vascular pedicles of the prostate were then ligated with Weck clips between the prostate and neurovascular bundles and divided with sharp cold scissor dissection resulting in neurovascular bundle preservation. The neurovascular bundles were then separated off the apex of the prostate and urethra bilaterally.  The urethra was then sharply transected allowing the prostate specimen to be disarticulated. The pelvis was copiously irrigated and hemostasis was ensured. There was no evidence for rectal injury.  Attention then turned to the right pelvic sidewall. The fibrofatty tissue between the external iliac vein, confluence of the iliac vessels, hypogastric artery, and Cooper's ligament was dissected free from the pelvic sidewall with care to preserve the obturator nerve. Weck clips were used for lymphostasis and hemostasis. An identical procedure was performed on the contralateral side and the lymphatic packets were removed for permanent pathologic analysis.  Attention then turned to the urethral anastomosis. A 2-0 Vicryl slip knot was placed between Denonvillier's fascia, the posterior bladder neck, and the posterior urethra to reapproximate these structures. A double-armed 3-0 Monocryl suture was then used to perform a 360 running tension-free anastomosis between the bladder neck and urethra. A new urethral catheter was then placed into the bladder and irrigated. There were no blood clots within the bladder and the anastomosis appeared to be watertight. A #19 Blake drain was then brought  through the left lateral 8 mm port site and positioned appropriately within the pelvis. It was secured to the skin with a nylon suture. The surgical cart was then undocked. The right lateral 12 mm port site was closed at the fascial level with a 0 Vicryl suture placed laparoscopically. All remaining ports were then removed under direct vision. The prostate specimen was removed intact within the Endopouch retrieval bag via the periumbilical camera port site. This fascial opening was closed with two running 0 Vicryl sutures. 0.5% Marcaine was then injected into all port sites and all incisions were reapproximated at the skin level with 4-0 Monocryl subcuticular sutures and Dermabond. The patient appeared to tolerate the procedure well and without complications. The patient was able to be extubated and transferred to the recovery unit in satisfactory condition.   Pryor Curia MD

## 2017-11-22 NOTE — Anesthesia Postprocedure Evaluation (Signed)
Anesthesia Post Note  Patient: Thomas Barnes  Procedure(s) Performed: XI ROBOTIC ASSISTED LAPAROSCOPIC RADICAL PROSTATECTOMY LEVEL 2 (N/A ) LYMPHADENECTOMY, PELVIC (Bilateral )     Patient location during evaluation: PACU Anesthesia Type: General Level of consciousness: sedated Pain management: pain level controlled Vital Signs Assessment: post-procedure vital signs reviewed and stable Respiratory status: spontaneous breathing and respiratory function stable Cardiovascular status: stable Postop Assessment: no apparent nausea or vomiting Anesthetic complications: no    Last Vitals:  Vitals:   11/22/17 1445 11/22/17 1509  BP: (!) 152/90 (!) 157/94  Pulse: 93 91  Resp: 16 16  Temp: 36.6 C 36.4 C  SpO2: 96% 98%    Last Pain:  Vitals:   11/22/17 1445  TempSrc:   PainSc: [place]

## 2017-11-23 ENCOUNTER — Encounter (HOSPITAL_COMMUNITY): Payer: Self-pay | Admitting: Urology

## 2017-11-23 DIAGNOSIS — C61 Malignant neoplasm of prostate: Secondary | ICD-10-CM | POA: Diagnosis not present

## 2017-11-23 LAB — HEMOGLOBIN AND HEMATOCRIT, BLOOD
HCT: 36.2 % — ABNORMAL LOW (ref 39.0–52.0)
Hemoglobin: 12.3 g/dL — ABNORMAL LOW (ref 13.0–17.0)

## 2017-11-23 MED ORDER — BISACODYL 10 MG RE SUPP
10.0000 mg | Freq: Once | RECTAL | Status: AC
  Administered 2017-11-23: 10 mg via RECTAL
  Filled 2017-11-23: qty 1

## 2017-11-23 MED ORDER — HYDROCODONE-ACETAMINOPHEN 5-325 MG PO TABS
1.0000 | ORAL_TABLET | Freq: Four times a day (QID) | ORAL | Status: DC | PRN
  Administered 2017-11-23: 2 via ORAL
  Filled 2017-11-23: qty 2

## 2017-11-23 NOTE — Progress Notes (Signed)
Patient ID: Thomas Barnes, male   DOB: 1945/10/11, 72 y.o.   MRN: [id number]  1 Day Post-Op Subjective: The patient is doing well.  No nausea or vomiting. Complains of pain at tip of penis and in abdomen.  Has been difficult for him to ambulate thus far.  Objective: Vital signs in last 24 hours: Temp:  [97.6 F (36.4 C)-99.5 F (37.5 C)] 99.1 F (37.3 C) (04/05 0614) Pulse Rate:  [65-95] 69 (04/05 0614) Resp:  [7-20] 20 (04/05 0614) BP: (114-163)/(62-103) 119/64 (04/05 0614) SpO2:  [92 %-100 %] 96 % (04/05 0614) Weight:  [102.1 kg (225 lb)] 102.1 kg (225 lb) (04/04 0951)  Intake/Output from previous day: 04/04 0701 - 04/05 0700 In: 4077.5 [P.O.:360; I.V.:3567.5; IV Piggyback:150] Out: 2255 [Urine:1870; Drains:185; Blood:200] Intake/Output this shift: No intake/output data recorded.  Physical Exam:  General: Alert and oriented. CV: RRR Lungs: Clear bilaterally. GI: Soft, Nondistended. Incisions: Clean, dry, and intact Urine: Clear Extremities: Nontender, no erythema, no edema.  Lab Results: Recent Labs    11/22/17 1434 11/23/17 0428  HGB 12.7* 12.3*  HCT 37.3* 36.2*      Assessment/Plan: POD# 1 s/p robotic prostatectomy.  1) SL IVF 2) Ambulate, Incentive spirometry 3) Transition to oral pain medication 4) Dulcolax suppository 5) D/C pelvic drain 6) Plan for likely discharge later today if improved   Thomas Barnes, Thomas Barnes. MD   LOS: 0 days   Thomas Barnes,LES 11/23/2017, 8:00 AM

## 2017-11-23 NOTE — Discharge Summary (Signed)
Date of admission: 11/22/2017  Date of discharge: 11/23/2017  Admission diagnosis: Prostate Cancer  Discharge diagnosis: Prostate Cancer  History and Physical: For full details, please see admission history and physical. Briefly, Thomas Barnes is a 72 y.o. gentleman with localized prostate cancer.  After discussing management/treatment options, he elected to proceed with surgical treatment.  Hospital Course: BRENDA COWHER was taken to the operating room on 11/22/2017 and underwent a robotic assisted laparoscopic radical prostatectomy. He tolerated this procedure well and without complications. Postoperatively, he was able to be transferred to a regular hospital room following recovery from anesthesia.  He was able to begin ambulating the night of surgery. He remained hemodynamically stable overnight.  He had excellent urine output with appropriately minimal output from his pelvic drain and his pelvic drain was removed on POD #1.  He was transitioned to oral pain medication, tolerated a clear liquid diet, and had met all discharge criteria and was able to be discharged home later on POD#1.  Laboratory values:  Recent Labs    11/22/17 1434 11/23/17 0428  HGB 12.7* 12.3*  HCT 37.3* 36.2*    Disposition: Home  Discharge instruction: He was instructed to be ambulatory but to refrain from heavy lifting, strenuous activity, or driving. He was instructed on urethral catheter care.  Discharge medications:   Allergies as of 11/23/2017      Reactions   Penicillins Hives   Childhood allergy Has patient had a PCN reaction causing immediate rash, facial/tongue/throat swelling, SOB or lightheadedness with hypotension: No Has patient had a PCN reaction causing severe rash involving mucus membranes or skin necrosis: Yes Has patient had a PCN reaction that required hospitalization: No Has patient had a PCN reaction occurring within the last 10 years: No If all of the above answers are "NO", then may  proceed with Cephalosporin use.      Medication List    STOP taking these medications   aspirin EC 81 MG tablet   EXCEDRIN EXTRA STRENGTH PO   Fish Oil 1200 MG Caps     TAKE these medications   acetaminophen 500 MG tablet Commonly known as:  TYLENOL Take 500-1,000 mg by mouth every 6 (six) hours as needed (for pain.).   allopurinol 300 MG tablet Commonly known as:  ZYLOPRIM Take 300 mg by mouth every evening.   HYDROcodone-acetaminophen 5-325 MG tablet Commonly known as:  NORCO Take 1-2 tablets by mouth every 6 (six) hours as needed for moderate pain or severe pain.   lisinopril 10 MG tablet Commonly known as:  PRINIVIL,ZESTRIL Take 10 mg by mouth daily.   minocycline 100 MG capsule Commonly known as:  MINOCIN,DYNACIN Take 100 mg by mouth 2 (two) times daily.   simvastatin 40 MG tablet Commonly known as:  ZOCOR Take 40 mg by mouth daily.   sulfamethoxazole-trimethoprim 800-160 MG tablet Commonly known as:  BACTRIM DS,SEPTRA DS Take 1 tablet by mouth 2 (two) times daily. Start the day prior to foley removal appointment       Followup: He will followup in 1 week for catheter removal and to discuss his surgical pathology results.

## 2017-11-23 NOTE — Progress Notes (Signed)
Patient and his wife given discharge, follow up, medication, foley catheter care and leg bag instructions, verbalized understanding, IV removed, personal belongings and prescriptions with patient, family to transport home

## 2018-01-18 ENCOUNTER — Other Ambulatory Visit: Payer: Self-pay | Admitting: Urology

## 2018-01-18 ENCOUNTER — Encounter (HOSPITAL_COMMUNITY): Payer: Self-pay | Admitting: General Practice

## 2018-01-21 ENCOUNTER — Ambulatory Visit (HOSPITAL_COMMUNITY): Payer: Medicare Other

## 2018-01-21 ENCOUNTER — Encounter (HOSPITAL_COMMUNITY)
Admission: RE | Disposition: A | Discharge status: Home / Self Care | Payer: Self-pay | Source: Ambulatory Visit | Attending: Urology

## 2018-01-21 ENCOUNTER — Encounter (HOSPITAL_COMMUNITY): Payer: Self-pay | Admitting: *Deleted

## 2018-01-21 ENCOUNTER — Ambulatory Visit (HOSPITAL_COMMUNITY)
Admission: RE | Admit: 2018-01-21 | Discharge: 2018-01-21 | Disposition: A | Discharge status: Home / Self Care | Payer: Medicare Other | Source: Ambulatory Visit | Attending: Urology | Admitting: Urology

## 2018-01-21 ENCOUNTER — Other Ambulatory Visit: Payer: Self-pay

## 2018-01-21 DIAGNOSIS — Z6831 Body mass index (BMI) 31.0-31.9, adult: Secondary | ICD-10-CM | POA: Diagnosis not present

## 2018-01-21 DIAGNOSIS — E7800 Pure hypercholesterolemia, unspecified: Secondary | ICD-10-CM | POA: Diagnosis not present

## 2018-01-21 DIAGNOSIS — Z96642 Presence of left artificial hip joint: Secondary | ICD-10-CM | POA: Insufficient documentation

## 2018-01-21 DIAGNOSIS — Z8546 Personal history of malignant neoplasm of prostate: Secondary | ICD-10-CM | POA: Diagnosis not present

## 2018-01-21 DIAGNOSIS — N201 Calculus of ureter: Secondary | ICD-10-CM | POA: Insufficient documentation

## 2018-01-21 DIAGNOSIS — M109 Gout, unspecified: Secondary | ICD-10-CM | POA: Insufficient documentation

## 2018-01-21 DIAGNOSIS — Z79899 Other long term (current) drug therapy: Secondary | ICD-10-CM | POA: Diagnosis not present

## 2018-01-21 DIAGNOSIS — G4730 Sleep apnea, unspecified: Secondary | ICD-10-CM | POA: Diagnosis not present

## 2018-01-21 DIAGNOSIS — I10 Essential (primary) hypertension: Secondary | ICD-10-CM | POA: Diagnosis not present

## 2018-01-21 DIAGNOSIS — E669 Obesity, unspecified: Secondary | ICD-10-CM | POA: Diagnosis not present

## 2018-01-21 DIAGNOSIS — N393 Stress incontinence (female) (male): Secondary | ICD-10-CM | POA: Insufficient documentation

## 2018-01-21 DIAGNOSIS — Z7982 Long term (current) use of aspirin: Secondary | ICD-10-CM | POA: Diagnosis not present

## 2018-01-21 HISTORY — DX: Personal history of other diseases of the respiratory system: Z87.09

## 2018-01-21 HISTORY — DX: Personal history of urinary calculi: Z87.442

## 2018-01-21 HISTORY — PX: EXTRACORPOREAL SHOCK WAVE LITHOTRIPSY: SHX1557

## 2018-01-21 SURGERY — LITHOTRIPSY, ESWL
Anesthesia: LOCAL | Laterality: Left

## 2018-01-21 MED ORDER — CIPROFLOXACIN HCL 500 MG PO TABS
500.0000 mg | ORAL_TABLET | ORAL | Status: AC
  Administered 2018-01-21: 500 mg via ORAL
  Filled 2018-01-21: qty 1

## 2018-01-21 MED ORDER — SODIUM CHLORIDE 0.9 % IV SOLN
INTRAVENOUS | Status: DC
  Administered 2018-01-21: 15:00:00 via INTRAVENOUS

## 2018-01-21 MED ORDER — DIPHENHYDRAMINE HCL 25 MG PO CAPS
25.0000 mg | ORAL_CAPSULE | ORAL | Status: AC
  Administered 2018-01-21: 25 mg via ORAL
  Filled 2018-01-21: qty 1

## 2018-01-21 MED ORDER — DIAZEPAM 5 MG PO TABS
10.0000 mg | ORAL_TABLET | ORAL | Status: AC
  Administered 2018-01-21: 10 mg via ORAL
  Filled 2018-01-21: qty 2

## 2018-01-21 NOTE — Discharge Instructions (Addendum)
1. You should strain your urine and collect all fragments and bring them to your follow up appointment.  °2. You should take your pain medication as needed.  Please call if your pain is severe to the point that it is not controlled with your pain medication. °3. You should call if you develop fever > 101 or persistent nausea or vomiting. °4. Your doctor may prescribe tamsulosin to take to help facilitate stone passage. °

## 2018-01-21 NOTE — H&P (Signed)
Office Visit Report     01/16/2018     --------------------------------------------------------------------------------     Thomas Barnes   MRN: [id number]  PRIMARY CARE:  Thomas Reel, MD   DOB: Apr 03, 1946, 72 year old Male  REFERRING:  Thomas Reel, MD   SSN: -[id number]  PROVIDER:  Raynelle Bring, M.D.     TREATING:  Thomas Barnes     LOCATION:  [facility] [phone]     --------------------------------------------------------------------------------     CC/HPI: 01/16/18: Patient with history of prostate cancer who is s/p robotic radical prostatectomy on 4/4. He presents today with acute onset left lower back pain and flank pain that began 2 days ago. He states that this pain has been occurring intermittently. He describes it as severe when it occurs. It does not radiate and nothing seems to make it better or worse. He states that his first episode of pain occurred two evenings ago and that after one hour his pain subsided. He states that he did not have any pain at all yesterday, but then developed severe flank pain and LLQ pain last night. He used pain medication he had on hand last night, which was beneficial. He denies pain currently. He does have some associated nausea with episodes or more severe pain. He denies exacerbation in voiding symptoms and remains fairly dry following his recent prostatectomy. He does note some incontinence with coughing and sneezing. He denies gross hematuria. He denies constipation or diarrhea. No testicular or perineal pain. He denies past history of renal stones.         ALLERGIES: Penicillin       MEDICATIONS: Allopurinol 300 mg tablet   Lisinopril 20 mg tablet   Simvastatin 40 mg tablet   Aspirin Ec 81 mg tablet, delayed release   Fish Oil 1,000 mg (120 mg-180 mg) capsule        GU PSH: Laparoscopy; Lymphadenectomy - 11/22/2017  Prostate Needle Biopsy - 09/27/2017  Robotic Radical Prostatectomy - 11/22/2017       NON-GU PSH: Bmi  >=30 [place] W/Followup - 10/23/2017  Doc Meds Verified W/Pt Or Re - 10/23/2017  Hip Replacement, Left - about 05/21/2017  Pain Neg No Plan - 10/23/2017  Tonsillectomy.. - 1954       GU PMH: Balanitis (Acute), May be early onset of balanitis. No acute signs of yeast. Will have him try Nystatin/Triamcinolone cream BID. Instructed to keep area as dry as possible. May use corn starch. IF not going out can forego pads and use cotton underwear. If pain persists will notify Dr. Alinda Money next week. Culture urine. No ABX unless culture proven UTI. - 12/06/2017  Stress Incontinence - 11/30/2017  Prostate Cancer - 10/09/2017  Elevated PSA - 04/18/2016         PMH Notes:     1) Prostate cancer: He is s/p a BNS RAL radical prostatectomy and BPLND on 11/22/17.     Diagnosis: pT3a N0 Mx, Gleason 4+5=9 adenocarcinoma with negative surgical margins   Pretreatment PSA: 7.51   Pretreatment SHIM score: 2     NON-GU PMH: Muscle weakness (generalized) - 10/23/2017  Gout  Hypercholesterolemia  Hypertension  Sleep Apnea       FAMILY HISTORY: ovarian cancer - Mother  prostate cancer in father - Father     SOCIAL HISTORY: Marital Status: Married  Preferred Language: English; Ethnicity: Not Hispanic Or Latino; Race: White  Current Smoking Status: Patient has never smoked.   Has never  drank.   Does not drink caffeine.       REVIEW OF SYSTEMS:     GU Review Male:   Patient reports get up at night to urinate. Patient denies frequent urination, hard to postpone urination, burning/ pain with urination, leakage of urine, stream starts and stops, trouble starting your stream, have to strain to urinate , erection problems, and penile pain.   Gastrointestinal (Upper):   Patient denies nausea, vomiting, and indigestion/ heartburn.   Gastrointestinal (Lower):   Patient denies diarrhea and constipation.   Constitutional:   Patient denies fever, night sweats, weight loss, and fatigue.   Skin:   Patient denies skin rash/ lesion  and itching.   Eyes:   Patient denies double vision and blurred vision.   Ears/ Nose/ Throat:   Patient denies sore throat and sinus problems.   Hematologic/Lymphatic:   Patient denies swollen glands and easy bruising.   Cardiovascular:   Patient denies leg swelling and chest pains.   Respiratory:   Patient denies cough and shortness of breath.   Endocrine:   Patient denies excessive thirst.   Musculoskeletal:   Patient denies back pain and joint pain.   Neurological:   Patient denies headaches and dizziness.   Psychologic:   Patient denies depression and anxiety.     VITAL SIGNS:       01/16/2018 03:52 PM   Weight 220 lb / 99.79 kg   Height 70 in / 177.8 cm   BP 129/79 mmHg   Pulse 69 /min   Temperature 98.3 F / 36.8 C   BMI 31.6 kg/m     GU PHYSICAL EXAMINATION:     Scrotum: No lesions. No edema. No cysts. No warts.    Epididymides: Right: no spermatocele, no masses, no cysts, no tenderness, no induration, no enlargement. Left: no spermatocele, no masses, no cysts, no tenderness, no induration, no enlargement.   Testes: No tenderness, no swelling, no enlargement left testes. No tenderness, no swelling, no enlargement right testes. Normal location left testes. Normal location right testes. No mass, no cyst, no varicocele, no hydrocele left testes. No mass, no cyst, no varicocele, no hydrocele right testes.   Urethral Meatus: Normal size. No lesion, no wart, no discharge, no polyp. Normal location.   Penis: Circumcised, no warts, no cracks. No dorsal Peyronie's plaques, no left corporal Peyronie's plaques, no right corporal Peyronie's plaques, no scarring, no warts. No balanitis, no meatal stenosis.     MULTI-SYSTEM PHYSICAL EXAMINATION:     Constitutional: Well-nourished. No physical deformities. Normally developed. Good grooming.   Respiratory: No labored breathing, no use of accessory muscles. Normal breath sounds.    Cardiovascular: Regular rate and rhythm. No murmur, no gallop.  Normal temperature, normal extremity pulses, no swelling, no varicosities.   Skin: No paleness, no jaundice, no cyanosis. No lesion, no ulcer, no rash. Incision sites have healed well and are non tender to palpation.    Neurologic / Psychiatric: Oriented to time, oriented to place, oriented to person. No depression, no anxiety, no agitation.   Gastrointestinal: No hernia. No mass, no tenderness, no rigidity, non obese abdomen.    Musculoskeletal: Spine, ribs, pelvis no bilateral tenderness. Normal gait and station of head and neck.        PAST DATA REVIEWED:   Source Of History:  Patient   Records Review:   Previous Patient Records   Urine Test Review:   Urinalysis    08/01/17 07/11/17 11/29/16   PSA   Total  PSA 6.01 ng/mL 7.51 ng/mL 3.40 ng/dl   Free PSA 0.73 ng/mL 0.70 ng/mL    % Free PSA 12 % PSA 9 % PSA        PROCEDURES:            Urinalysis w/Scope - 81001  Dipstick Dipstick Cont'd Micro   Color: Yellow Bilirubin: Neg WBC/hpf: 0 - 5/hpf   Appearance: Cloudy Ketones: Neg RBC/hpf: >60/hpf   Specific Gravity: 1.025 Blood: 3+ Bacteria: Rare (0-9/hpf)   pH: <=5.0 Protein: Trace Cystals: NS (Not Seen)   Glucose: Neg Urobilinogen: 0.2 Casts: Hyaline     Nitrites: Neg Trichomonas: Not Present     Leukocyte Esterase: Neg Mucous: Present       Epithelial Cells: NS (Not Seen)       Yeast: NS (Not Seen)       Sperm: Not Present       Notes:          ASSESSMENT:       ICD-10 Details   1 GU:   Prostate Cancer - C61    2   Stress Incontinence - N39.3    3   Flank Pain - R10.84    4   Microscopic hematuria - R31.21      PLAN:               Medications  Stop Meds: Nystatin-Triamcinolone 100,000 unit/gram-0.1 % cream Apply small amount to glans BID  Start: 12/06/2017   Discontinue: 01/16/2018  - Reason: The medication cycle was completed.                Orders  Labs Urine Culture   X-Rays: C.T. Stone Protocol Without Contrast             Schedule  Return Visit/Planned  Activity: ASAP - C.T. Stone Protocol              Note: Tomorrow morning, if possible. Debbie had already left for the day today             Document  Letter(s):  Created for Patient: Clinical Summary            Notes:   He does have microscopic hematuria on UA today, however, this could be potentially related to previous surgical procedure. However given his presentation today, I would like to move forward with imaging for further evaluation of his pain. Unfortunately, due to timing in the clinic today we will be unable to perform C.T imaging. Therefore, I will bring him back tomorrow for imaging studies. Return precautions to [facility] ED given for progressive pain in the over night hours. We discussed that I would call with results and follow up will be pending.           Next Appointment:       Next Appointment: 02/01/2018 11:00 AM     Appointment Type: 60 Physical Therapy     Location: [facility] [phone]     Provider: Doran Durand          * Signed by Thomas Barnes on 01/17/18 at 8:05 AM (EDT)*      Regarding: Left ureteral stone Discussion Occurred With: Patient Thomas Barnes)  Message: I spoke with [facility] regarding the patient's CT scan indicating a 10 x 6 mm left proximal ureteral stone. I spoke with the patient and discussed options including the possibility of shockwave lithotripsy versus left ureteroscopic laser lithotripsy. We reviewed the pros and cons of  each approach. Considering his recent radical prostatectomy 2 months ago, we agreed that he would be best served with shockwave lithotripsy if appropriate. He has not had prior stones but is Hounsfield unit measurements were approximately 850. For this reason, he presented today and had a KUB x-ray performed. This confirmed a radiopaque calcification over the expected location of the left proximal ureter. He will be scheduled for shockwave lithotripsy on Monday. The potential risks, complications, and the  expected recovery process were discussed.   * Signed by Raynelle Bring, M.D. on 01/18/18 at 11:11 AM (EDT)*

## 2018-01-21 NOTE — Op Note (Signed)
See scanned chart for ESW[facility] operative note. 

## 2018-01-22 ENCOUNTER — Encounter (HOSPITAL_COMMUNITY): Payer: Self-pay | Admitting: Urology

## 2018-02-28 ENCOUNTER — Other Ambulatory Visit: Payer: Self-pay | Admitting: Urology

## 2018-03-15 NOTE — H&P (Signed)
Office Visit Report     03/08/2018   --------------------------------------------------------------------------------   Thomas Barnes  MRN: [id number]  PRIMARY CARE:  Thomas Reel, MD  DOB: [birth year], 72 year old Male  REFERRING:  Thomas Reel, MD  SSN: -**[id number]  PROVIDER:  Raynelle Bring, M.D.    LOCATION:  [facility] [phone]   --------------------------------------------------------------------------------   CC/HPI: 1. Prostate cancer  2. Left ureteral calculus   Mr. Thomas Barnes returns today for his regularly scheduled follow-up of his prostate cancer after undergoing a radical prostatectomy in early April. As noted below, he did have locally advanced and high-grade disease but with negative surgical margins and no lymph node involvement. He has regained continence and is no longer requiring pads. Unfortunately, he did get diagnosed with a proximal left ureteral stone. He underwent shockwave lithotripsy in did pass 1 fragment. However, he has persisted in having significant stone burden in the proximal ureter now measuring approximately 7-8 mm compared to 10 mm prior to his shockwave therapy in early June. Fortunately, he has not been having significant flank pain, hematuria, or other complaints. He denies any fever or nausea/vomiting. He did actually injure his left ribs recently and has had some discomfort over his left side for that reason although this is significantly improving. His pain is worse with certain movements indicating that this is musculoskeletal.     ALLERGIES: Penicillin    MEDICATIONS: Allopurinol 300 mg tablet  Lisinopril 20 mg tablet  Simvastatin 40 mg tablet  Tamsulosin Hcl 0.4 mg capsule 1 capsule PO Daily  Aspirin Ec 81 mg tablet, delayed release  Fish Oil 1,000 mg (120 mg-180 mg) capsule  Hydrocodone-Acetaminophen 5 mg-325 mg tablet 1 tablet PO Q 6 H PRN  Zofran 4 mg tablet 1 tablet PO Q 8 H PRN     GU PSH: ESWL -  01/21/2018 Laparoscopy; Lymphadenectomy - 11/22/2017 Prostate Needle Biopsy - 09/27/2017 Robotic Radical Prostatectomy - 11/22/2017    NON-GU PSH: Bmi >=30 Calcuate W/Followup - 10/23/2017 Doc Meds Verified W/Pt Or Re - 10/23/2017 Hip Replacement, Left - about 05/21/2017 Pain Neg No Plan - 10/23/2017 Tonsillectomy.. - 1954    GU PMH: Ureteral calculus - 02/04/2018 Flank Pain - 01/16/2018 Microscopic hematuria - 01/16/2018 Stress Incontinence - 11/30/2017 Prostate Cancer - 10/09/2017      PMH Notes:   1) Prostate cancer: He is s/p a BNS RAL radical prostatectomy and BPLND on 11/22/17.   Diagnosis: pT3a N0 Mx, Gleason 4+5=9 adenocarcinoma with negative surgical margins  Pretreatment PSA: 7.51  Pretreatment SHIM score: 2   2) Urolithiasis: He presented with his first stone episode in June 2019.   Jun 2019: L ESWL 10 mm left proximal ureteral stone   NON-GU PMH: Muscle weakness (generalized) - 10/23/2017 Gout Hypercholesterolemia Hypertension Sleep Apnea    FAMILY HISTORY: ovarian cancer - Mother prostate cancer in father - Father   SOCIAL HISTORY: Marital Status: Married Preferred Language: English; Ethnicity: Not Hispanic Or Latino; Race: White Current Smoking Status: Patient has never smoked.  Has never drank.  Does not drink caffeine.    REVIEW OF SYSTEMS:    GU Review Male:   Patient denies frequent urination, hard to postpone urination, burning/ pain with urination, get up at night to urinate, leakage of urine, stream starts and stops, trouble starting your streams, and have to strain to urinate .  Gastrointestinal (Upper):   Patient denies nausea and vomiting.  Gastrointestinal (Lower):   Patient denies diarrhea and constipation.  Constitutional:   Patient denies fever, night sweats, weight loss, and fatigue.  Skin:   Patient denies skin rash/ lesion and itching.  Eyes:   Patient denies blurred vision and double vision.  Ears/ Nose/ Throat:   Patient denies sore throat and sinus  problems.  Hematologic/Lymphatic:   Patient denies swollen glands and easy bruising.  Cardiovascular:   Patient denies leg swelling and chest pains.  Respiratory:   Patient denies cough and shortness of breath.  Endocrine:   Patient denies excessive thirst.  Musculoskeletal:   Patient denies back pain and joint pain.  Neurological:   Patient denies headaches and dizziness.  Psychologic:   Patient denies depression and anxiety.   VITAL SIGNS: None   MULTI-SYSTEM PHYSICAL EXAMINATION:    Constitutional: Well-nourished. No physical deformities. Normally developed. Good grooming.  Respiratory: No labored breathing, no use of accessory muscles. Normal breath sounds.  Cardiovascular: Regular rate and rhythm. No murmur, no gallop. Normal temperature, normal extremity pulses, no swelling, no varicosities.  Gastrointestinal: No mass, no tenderness, no rigidity, non obese abdomen. No CVAT. His incisions are healing well.     PAST DATA REVIEWED:  Source Of History:  Patient  Lab Test Review:   PSA  Urine Test Review:   Urinalysis  X-Ray Review: KUB: Reviewed Films.     03/01/18 08/01/17 07/11/17 11/29/16  PSA  Total PSA 0.033 ng/mL 6.01 ng/mL 7.51 ng/mL 3.40 ng/dl  Free PSA  0.73 ng/mL 0.70 ng/mL   % Free PSA  12 % PSA 9 % PSA     PROCEDURES:         KUB - 10258  A single view of the abdomen is obtained.               Urinalysis w/Scope Dipstick Dipstick Cont'd Micro  Color: Yellow Bilirubin: Neg mg/dL WBC/hpf: 0 - 5/hpf  Appearance: Clear Ketones: Trace mg/dL RBC/hpf: 3 - 10/hpf  Specific Gravity: 1.030 Blood: 2+ ery/uL Bacteria: Rare (0-9/hpf)  pH: 6.0 Protein: 2+ mg/dL Cystals: NS (Not Seen)  Glucose: Neg mg/dL Urobilinogen: 1.0 mg/dL Casts: Hyaline    Nitrites: Neg Trichomonas: Not Present    Leukocyte Esterase: Neg leu/uL Mucous: Present      Epithelial Cells: 0 - 5/hpf      Yeast: NS (Not Seen)      Sperm: Not Present         Notes:   I independently reviewed his KUB  x-ray. This demonstrates a stable 8 mm calcification over the expected course of the left ureter consistent with his known ureteral calculus.   ASSESSMENT:      ICD-10 Details  1 GU:   Prostate Cancer - C61   2   Stress Incontinence - N39.3   3   Ureteral calculus - N20.1    PLAN:           Orders Labs Urine Culture          Schedule Labs: 6 Months - PSA    6 Months - Urinalysis  Return Visit/Planned Activity: 6 Months - Office Visit          Document Letter(s):  Created for Patient: Clinical Summary         Notes:   1. Locally advanced prostate cancer: His PSA is undetectable. He will plan to follow up in 6 months for continued PSA surveillance.   2. Incontinence: This has resolved.   3. Left ureteral calculus: We again reviewed options for treatment. Currently, he is scheduled  to proceed with repeat shockwave lithotripsy therapy. He does wish to avoid ureteroscopy considering his recent surgery on his urinary tract as he does not want to increase his risk of developing incontinence again. This would seem reasonable. We again reviewed the potential risks, complications, and expected recovery process associated with shockwave lithotripsy. This is scheduled for July 29th.   Cc: Dr. Shon Baton        Next Appointment:      Next Appointment: 03/18/2018 08:45 AM    Appointment Type: Surgery     Location: [facility] [phone]    Provider: Raynelle Bring, M.D.    Reason for Visit: WL/OP LT ESWL      * Signed by Raynelle Bring, M.D. on 03/08/18 at 6:15 PM (EDT)*

## 2018-03-18 ENCOUNTER — Ambulatory Visit (HOSPITAL_COMMUNITY)
Admission: RE | Admit: 2018-03-18 | Discharge: 2018-03-18 | Disposition: A | Discharge status: Home / Self Care | Payer: Medicare Other | Source: Ambulatory Visit | Attending: Urology | Admitting: Urology

## 2018-03-18 ENCOUNTER — Other Ambulatory Visit: Payer: Self-pay

## 2018-03-18 ENCOUNTER — Encounter (HOSPITAL_COMMUNITY)
Admission: RE | Disposition: A | Discharge status: Home / Self Care | Payer: Self-pay | Source: Ambulatory Visit | Attending: Urology

## 2018-03-18 ENCOUNTER — Ambulatory Visit (HOSPITAL_COMMUNITY): Payer: Medicare Other

## 2018-03-18 ENCOUNTER — Encounter (HOSPITAL_COMMUNITY): Payer: Self-pay | Admitting: *Deleted

## 2018-03-18 DIAGNOSIS — Z9079 Acquired absence of other genital organ(s): Secondary | ICD-10-CM | POA: Diagnosis not present

## 2018-03-18 DIAGNOSIS — E669 Obesity, unspecified: Secondary | ICD-10-CM | POA: Diagnosis not present

## 2018-03-18 DIAGNOSIS — Z7982 Long term (current) use of aspirin: Secondary | ICD-10-CM | POA: Diagnosis not present

## 2018-03-18 DIAGNOSIS — N393 Stress incontinence (female) (male): Secondary | ICD-10-CM | POA: Insufficient documentation

## 2018-03-18 DIAGNOSIS — C61 Malignant neoplasm of prostate: Secondary | ICD-10-CM | POA: Diagnosis not present

## 2018-03-18 DIAGNOSIS — Z96642 Presence of left artificial hip joint: Secondary | ICD-10-CM | POA: Insufficient documentation

## 2018-03-18 DIAGNOSIS — E7800 Pure hypercholesterolemia, unspecified: Secondary | ICD-10-CM | POA: Diagnosis not present

## 2018-03-18 DIAGNOSIS — I10 Essential (primary) hypertension: Secondary | ICD-10-CM | POA: Insufficient documentation

## 2018-03-18 DIAGNOSIS — G4730 Sleep apnea, unspecified: Secondary | ICD-10-CM | POA: Insufficient documentation

## 2018-03-18 DIAGNOSIS — M109 Gout, unspecified: Secondary | ICD-10-CM | POA: Diagnosis not present

## 2018-03-18 DIAGNOSIS — Z79899 Other long term (current) drug therapy: Secondary | ICD-10-CM | POA: Insufficient documentation

## 2018-03-18 DIAGNOSIS — N201 Calculus of ureter: Secondary | ICD-10-CM | POA: Insufficient documentation

## 2018-03-18 HISTORY — PX: EXTRACORPOREAL SHOCK WAVE LITHOTRIPSY: SHX1557

## 2018-03-18 SURGERY — LITHOTRIPSY, ESWL
Anesthesia: LOCAL | Laterality: Left

## 2018-03-18 MED ORDER — CIPROFLOXACIN HCL 500 MG PO TABS
500.0000 mg | ORAL_TABLET | ORAL | Status: AC
  Administered 2018-03-18: 500 mg via ORAL
  Filled 2018-03-18: qty 1

## 2018-03-18 MED ORDER — DIPHENHYDRAMINE HCL 25 MG PO CAPS
25.0000 mg | ORAL_CAPSULE | ORAL | Status: AC
  Administered 2018-03-18: 25 mg via ORAL
  Filled 2018-03-18: qty 1

## 2018-03-18 MED ORDER — SODIUM CHLORIDE 0.9 % IV SOLN
INTRAVENOUS | Status: DC
  Administered 2018-03-18: 08:00:00 via INTRAVENOUS

## 2018-03-18 MED ORDER — DIAZEPAM 5 MG PO TABS
10.0000 mg | ORAL_TABLET | ORAL | Status: AC
  Administered 2018-03-18: 10 mg via ORAL
  Filled 2018-03-18: qty 2

## 2018-03-18 NOTE — Interval H&P Note (Signed)
History and Physical Interval Note:  03/18/2018 9:10 AM  Thomas Barnes  has presented today for surgery, with the diagnosis of LEFT URETERAL CALCULUS  The various methods of treatment have been discussed with the patient and family. After consideration of risks, benefits and other options for treatment, the patient has consented to  Procedure(s): LEFT EXTRACORPOREAL SHOCK WAVE LITHOTRIPSY (ESWL) (Left) as a surgical intervention .  The patient's history has been reviewed, patient examined, no change in status, stable for surgery.  I have reviewed the patient's chart and labs.  Questions were answered to the patient's satisfaction.     Felesha Moncrieffe,LES

## 2018-03-18 NOTE — Op Note (Signed)
Please see scanned chart for ESWL operative note.

## 2018-03-18 NOTE — Discharge Instructions (Addendum)
Moderate Conscious Sedation, Adult, Care After These instructions provide you with information about caring for yourself after your procedure. Your health care provider may also give you more specific instructions. Your treatment has been planned according to current medical practices, but problems sometimes occur. Call your health care provider if you have any problems or questions after your procedure. What can I expect after the procedure? After your procedure, it is common:  To feel sleepy for several hours.  To feel clumsy and have poor balance for several hours.  To have poor judgment for several hours.  To vomit if you eat too soon.  Follow these instructions at home: For at least 24 hours after the procedure:   Do not: ? Participate in activities where you could fall or become injured. ? Drive. ? Use heavy machinery. ? Drink alcohol. ? Take sleeping pills or medicines that cause drowsiness. ? Make important decisions or sign legal documents. ? Take care of children on your own.  Rest. Eating and drinking  Follow the diet recommended by your health care provider.  If you vomit: ? Drink water, juice, or soup when you can drink without vomiting. ? Make sure you have little or no nausea before eating solid foods. General instructions  Have a responsible adult stay with you until you are awake and alert.  Take over-the-counter and prescription medicines only as told by your health care provider.  If you smoke, do not smoke without supervision.  Keep all follow-up visits as told by your health care provider. This is important. Contact a health care provider if:  You keep feeling nauseous or you keep vomiting.  You feel light-headed.  You develop a rash.  You have a fever. Get help right away if:  You have trouble breathing. This information is not intended to replace advice given to you by your health care provider. Make sure you discuss any questions you have  with your health care provider. Document Released: 05/28/2013 Document Revised: 01/10/2016 Document Reviewed: 11/27/2015 Elsevier Interactive Patient Education  2018 [place] After This sheet gives you information about how to care for yourself after your procedure. Your health care provider may also give you more specific instructions. If you have problems or questions, contact your health care provider. What can I expect after the procedure? After the procedure, it is common to have:  Some blood in your urine. This should only last for a few days.  Soreness in your back, sides, or upper abdomen for a few days.  Blotches or bruises on your back where the pressure wave entered the skin.  Pain, discomfort, or nausea when pieces (fragments) of the kidney stone move through the tube that carries urine from the kidney to the bladder (ureter). Stone fragments may pass soon after the procedure, but they may continue to pass for up to 4-8 weeks. ? If you have severe pain or nausea, contact your health care provider. This may be caused by a large stone that was not broken up, and this may mean that you need more treatment.  Some pain or discomfort during urination.  Some pain or discomfort in the lower abdomen or (in men) at the base of the penis.  Follow these instructions at home: Medicines  Take over-the-counter and prescription medicines only as told by your health care provider.  If you were prescribed an antibiotic medicine, take it as told by your health care provider. Do not stop taking the antibiotic even if you  start to feel better.  Do not drive for 24 hours if you were given a medicine to help you relax (sedative).  Do not drive or use heavy machinery while taking prescription pain medicine. Eating and drinking  Drink enough water and fluids to keep your urine clear or pale yellow. This helps any remaining pieces of the stone to pass. It can also help  prevent new stones from forming.  Eat plenty of fresh fruits and vegetables.  Follow instructions from your health care provider about eating and drinking restrictions. You may be instructed: ? To reduce how much salt (sodium) you eat or drink. Check ingredients and nutrition facts on packaged foods and beverages. ? To reduce how much meat you eat.  Eat the recommended amount of calcium for your age and gender. Ask your health care provider how much calcium you should have. General instructions  Get plenty of rest.  Most people can resume normal activities 1-2 days after the procedure. Ask your health care provider what activities are safe for you.  If directed, strain all urine through the strainer that was provided by your health care provider. ? Keep all fragments for your health care provider to see. Any stones that are found may be sent to a medical lab for examination. The stone may be as small as a grain of salt.  Keep all follow-up visits as told by your health care provider. This is important. Contact a health care provider if:  You have pain that is severe or does not get better with medicine.  You have nausea that is severe or does not go away.  You have blood in your urine longer than your health care provider told you to expect.  You have more blood in your urine.  You have pain during urination that does not go away.  You urinate more frequently than usual and this does not go away.  You develop a rash or any other possible signs of an allergic reaction. Get help right away if:  You have severe pain in your back, sides, or upper abdomen.  You have severe pain while urinating.  Your urine is very dark red.  You have blood in your stool (feces).  You cannot pass any urine at all.  You feel a strong urge to urinate after emptying your bladder.  You have a fever or chills.  You develop shortness of breath, difficulty breathing, or chest pain.  You have  severe nausea that leads to persistent vomiting.  You faint. Summary  After this procedure, it is common to have some pain, discomfort, or nausea when pieces (fragments) of the kidney stone move through the tube that carries urine from the kidney to the bladder (ureter). If this pain or nausea is severe, however, you should contact your health care provider.  Most people can resume normal activities 1-2 days after the procedure. Ask your health care provider what activities are safe for you.  Drink enough water and fluids to keep your urine clear or pale yellow. This helps any remaining pieces of the stone to pass, and it can help prevent new stones from forming.  If directed, strain your urine and keep all fragments for your health care provider to see. Fragments or stones may be as small as a grain of salt.  Get help right away if you have severe pain in your back, sides, or upper abdomen or have severe pain while urinating. This information is not intended to replace advice  given to you by your health care provider. Make sure you discuss any questions you have with your health care provider. Document Released: 08/27/2007 Document Revised: 06/28/2016 Document Reviewed: 06/28/2016 Elsevier Interactive Patient Education  2018 [place] should strain your urine and collect all fragments and bring them to your follow up appointment.  2. You should take your pain medication as needed.  Please call if your pain is severe to the point that it is not controlled with your pain medication. 3. You should call if you develop fever > 101 or persistent nausea or vomiting. 4. Your doctor may prescribe tamsulosin to take to help facilitate stone passage.

## 2018-05-20 ENCOUNTER — Other Ambulatory Visit: Payer: Self-pay | Admitting: Urology

## 2018-05-20 ENCOUNTER — Other Ambulatory Visit: Payer: Self-pay

## 2018-05-20 ENCOUNTER — Encounter (HOSPITAL_COMMUNITY): Payer: Self-pay | Admitting: *Deleted

## 2018-05-21 NOTE — H&P (Signed)
Office Visit Report     05/17/2018   --------------------------------------------------------------------------------   Thomas Barnes  MRN: [id number]  PRIMARY CARE:  Gwen Pounds, MD  DOB: Sep 01, 1945, 72 year old Male  REFERRING:  Gwen Pounds, MD  SSN: -**[id number]  PROVIDER:  Rutherford Nail, M.D.    LOCATION:  [facility] [phone]   --------------------------------------------------------------------------------   CC: I have kidney stones.  HPI: Thomas Barnes is a 72 year-old male established patient who is here for renal calculi.  The problem is on the left side. He is currently having flank pain and back pain. He denies having groin pain, nausea, vomiting, fever, and chills. He has not caught a stone in his urine strainer since his symptoms began.   He has had eswl for treatment of his stones in the past.   The patient has had ESWL 2. He continues to have persistent left distal ureteral calculus. This is still present on KUB today. He is a patient of Dr. Laverle Patter and has had a prostatectomy back in April. Last night, he was up all night with severe left-sided pain. He did take hydrocodone and currently his pain is controlled and he is doing okay. Denies any fever, nausea, vomiting. He is frustrated with this stone and desires intervention.     ALLERGIES: Penicillin    MEDICATIONS: Allopurinol 300 mg tablet  Lisinopril 20 mg tablet  Simvastatin 40 mg tablet  Aspirin Ec 81 mg tablet, delayed release  Fish Oil 1,000 mg (120 mg-180 mg) capsule  Hydrocodone-Acetaminophen 5 mg-325 mg tablet 1 tablet PO Q 6 H PRN  Zofran 4 mg tablet 1 tablet PO Q 8 H PRN     GU PSH: ESWL - 03/18/2018, 01/21/2018 Laparoscopy; Lymphadenectomy - 11/22/2017 Prostate Needle Biopsy - 09/27/2017 Robotic Radical Prostatectomy - 11/22/2017    NON-GU PSH: Bmi >=30 Calcuate W/Followup - 10/23/2017 Doc Meds Verified W/Pt Or Re - 10/23/2017 Hip Replacement, Left - about 05/21/2017 Pain Neg No  Plan - 10/23/2017 Tonsillectomy.. - 1954    GU PMH: Ureteral calculus - 02/04/2018 Flank Pain - 01/16/2018 Microscopic hematuria - 01/16/2018 Stress Incontinence - 11/30/2017 Prostate Cancer - 10/09/2017      PMH Notes:   1) Prostate cancer: He is s/p a BNS RAL radical prostatectomy and BPLND on 11/22/17.   Diagnosis: pT3a N0 Mx, Gleason 4+5=9 adenocarcinoma with negative surgical margins  Pretreatment PSA: 7.51  Pretreatment SHIM score: 2   2) Urolithiasis: He presented with his first stone episode in June 2019.   Jun 2019: L ESWL 10 mm left proximal ureteral stone  Aug 2019: Repeat L ESWL left proximal ureteral stone   NON-GU PMH: Muscle weakness (generalized) - 10/23/2017 Gout Hypercholesterolemia Hypertension Sleep Apnea    FAMILY HISTORY: ovarian cancer - Mother prostate cancer in father - Father   SOCIAL HISTORY: Marital Status: Married Preferred Language: English; Ethnicity: Not Hispanic Or Latino; Race: White Current Smoking Status: Patient has never smoked.  Has never drank.  Does not drink caffeine.    REVIEW OF SYSTEMS:    GU Review Male:   Patient denies frequent urination, hard to postpone urination, burning/ pain with urination, get up at night to urinate, leakage of urine, stream starts and stops, trouble starting your stream, have to strain to urinate , erection problems, and penile pain.  Gastrointestinal (Upper):   Patient reports nausea and vomiting. Patient denies indigestion/ heartburn.  Gastrointestinal (Lower):   Patient denies diarrhea and constipation.  Constitutional:  Patient denies fever, night sweats, weight loss, and fatigue.  Skin:   Patient denies itching and skin rash/ lesion.  Eyes:   Patient denies blurred vision and double vision.  Ears/ Nose/ Throat:   Patient denies sore throat and sinus problems.  Hematologic/Lymphatic:   Patient denies swollen glands and easy bruising.  Cardiovascular:   Patient denies leg swelling and chest pains.   Respiratory:   Patient denies cough and shortness of breath.  Endocrine:   Patient denies excessive thirst.  Musculoskeletal:   Patient reports back pain. Patient denies joint pain.  Neurological:   Patient denies headaches and dizziness.  Psychologic:   Patient denies depression and anxiety.   Notes: bladder pressure    VITAL SIGNS:      05/17/2018 10:02 AM  BP 126/81 mmHg  Heart Rate 71 /min  Temperature 98.3 F / 36.8 C   MULTI-SYSTEM PHYSICAL EXAMINATION:    Constitutional: Well-nourished. No physical deformities. Normally developed. Good grooming.  Respiratory: No labored breathing, no use of accessory muscles.   Cardiovascular: Normal temperature, adequate perfusion of extremities  Skin: No paleness, no jaundice  Neurologic / Psychiatric: Oriented to time, oriented to place, oriented to person. No depression, no anxiety, no agitation.  Gastrointestinal: No mass, no tenderness, no rigidity, mildly obese abdomen. No CVA tenderness bilaterally  Eyes: Normal conjunctivae. Normal eyelids.  Musculoskeletal: Normal gait and station of head and neck.     PAST DATA REVIEWED:  Source Of History:  Patient  X-Ray Review: KUB: Reviewed Films. Discussed With Patient.     03/01/18 08/01/17 07/11/17 11/29/16  PSA  Total PSA 0.033 ng/mL 6.01 ng/mL 7.51 ng/mL 3.40 ng/dl  Free PSA  2.54 ng/mL 2.70 ng/mL   % Free PSA  12 % PSA 9 % PSA     PROCEDURES:         KUB - 74018  A single view of the abdomen is obtained.  Persistent distal left ureteral calculus.               Urinalysis w/Scope Dipstick Dipstick Cont'd Micro  Color: Yellow Bilirubin: Neg mg/dL WBC/hpf: 0 - 5/hpf  Appearance: Clear Ketones: Trace mg/dL RBC/hpf: NS (Not Seen)  Specific Gravity: 1.025 Blood: Neg ery/uL Bacteria: NS (Not Seen)  pH: <=5.0 Protein: 1+ mg/dL Cystals: NS (Not Seen)  Glucose: Neg mg/dL Urobilinogen: 0.2 mg/dL Casts: NS (Not Seen)    Nitrites: Neg Trichomonas: Not Present    Leukocyte  Esterase: Neg leu/uL Mucous: Present      Epithelial Cells: 0 - 5/hpf      Yeast: NS (Not Seen)      Sperm: Not Present    Notes: Microscopic done on unconcentrated urine    ASSESSMENT:      ICD-10 Details  1 GU:   Ureteral calculus - N20.1   2   Flank Pain - R10.84    PLAN:            Medications New Meds: Tamsulosin Hcl 0.4 mg capsule 1 capsule PO Daily   #15  1 Refill(s)            Orders Labs Urine Culture  X-Rays: KUB          Schedule         Document Letter(s):  Created for Patient: Clinical Summary         Notes:   We discussed the management of urinary stones. These options include observation, ureteroscopy, and shockwave lithotripsy. We discussed which options are relevant  to these particular stones. We discussed the natural history of stones as well as the complications of untreated stones and the impact on quality of life without treatment as well as with each of the above listed treatments. We also discussed the efficacy of each treatment in its ability to clear the stone burden. With any of these management options I discussed the signs and symptoms of infection and the need for emergent treatment should these be experienced. For each option we discussed the ability of each procedure to clear the patient of their stone burden.   For observation I described the risks which include but are not limited to silent renal damage, life-threatening infection, need for emergent surgery, failure to pass stone, and pain.   For ureteroscopy I described the risks which include heart attack, stroke, pulmonary embolus, death, bleeding, infection, damage to contiguous structures, positioning injury, ureteral stricture, ureteral avulsion, ureteral injury, need for ureteral stent, inability to perform ureteroscopy, need for an interval procedure, inability to clear stone burden, stent discomfort and pain.   For shockwave lithotripsy I described the risks which include arrhythmia,  kidney contusion, kidney hemorrhage, need for transfusion, pain, inability to break up stone, inability to pass stone fragments, Steinstrasse, infection associated with obstructing stones, need for different surgical procedure, need for repeat shockwave lithotripsy.   Given that he has failed ESWL 2, he would like to proceed with ureteroscopy. I will notify Dr. Laverle Patter.   Cc: Heloise Purpura, M.D.  Creola Corn, M.D.        Next Appointment:      Next Appointment: 06/20/2018 03:15 PM    Appointment Type: Renal Ultrasound    Location: [facility] [phone]    Provider: Radiology Rm1 Radiology Rm 1    Reason for Visit: 2 mo Renal Ultrasound (Limited) - Melina Mosteller      * Signed by Modena Slater, III, M.D. on 05/17/18 at 10:30 AM (EDT)*

## 2018-05-22 ENCOUNTER — Ambulatory Visit (HOSPITAL_COMMUNITY)
Admission: RE | Admit: 2018-05-22 | Discharge: 2018-05-22 | Disposition: A | Discharge status: Home / Self Care | Payer: Medicare Other | Source: Ambulatory Visit | Attending: Urology | Admitting: Urology

## 2018-05-22 ENCOUNTER — Ambulatory Visit (HOSPITAL_COMMUNITY): Payer: Medicare Other | Admitting: Anesthesiology

## 2018-05-22 ENCOUNTER — Ambulatory Visit (HOSPITAL_COMMUNITY): Payer: Medicare Other

## 2018-05-22 ENCOUNTER — Encounter (HOSPITAL_COMMUNITY): Payer: Self-pay | Admitting: *Deleted

## 2018-05-22 ENCOUNTER — Encounter (HOSPITAL_COMMUNITY)
Admission: RE | Disposition: A | Discharge status: Home / Self Care | Payer: Self-pay | Source: Ambulatory Visit | Attending: Urology

## 2018-05-22 DIAGNOSIS — C61 Malignant neoplasm of prostate: Secondary | ICD-10-CM | POA: Diagnosis not present

## 2018-05-22 DIAGNOSIS — Z79899 Other long term (current) drug therapy: Secondary | ICD-10-CM | POA: Insufficient documentation

## 2018-05-22 DIAGNOSIS — Z7982 Long term (current) use of aspirin: Secondary | ICD-10-CM | POA: Insufficient documentation

## 2018-05-22 DIAGNOSIS — G4730 Sleep apnea, unspecified: Secondary | ICD-10-CM | POA: Insufficient documentation

## 2018-05-22 DIAGNOSIS — Z9079 Acquired absence of other genital organ(s): Secondary | ICD-10-CM | POA: Insufficient documentation

## 2018-05-22 DIAGNOSIS — M1990 Unspecified osteoarthritis, unspecified site: Secondary | ICD-10-CM | POA: Insufficient documentation

## 2018-05-22 DIAGNOSIS — E669 Obesity, unspecified: Secondary | ICD-10-CM | POA: Diagnosis not present

## 2018-05-22 DIAGNOSIS — Z96642 Presence of left artificial hip joint: Secondary | ICD-10-CM | POA: Diagnosis not present

## 2018-05-22 DIAGNOSIS — Z880 Allergy status to penicillin: Secondary | ICD-10-CM | POA: Insufficient documentation

## 2018-05-22 DIAGNOSIS — I10 Essential (primary) hypertension: Secondary | ICD-10-CM | POA: Diagnosis not present

## 2018-05-22 DIAGNOSIS — Z6832 Body mass index (BMI) 32.0-32.9, adult: Secondary | ICD-10-CM | POA: Diagnosis not present

## 2018-05-22 DIAGNOSIS — N201 Calculus of ureter: Secondary | ICD-10-CM | POA: Diagnosis not present

## 2018-05-22 HISTORY — PX: CYSTOSCOPY/URETEROSCOPY/HOLMIUM LASER/STENT PLACEMENT: SHX6546

## 2018-05-22 LAB — CBC
HCT: 42.1 % (ref 39.0–52.0)
Hemoglobin: 14.6 g/dL (ref 13.0–17.0)
MCH: 28 pg (ref 26.0–34.0)
MCHC: 34.7 g/dL (ref 30.0–36.0)
MCV: 80.8 fL (ref 78.0–100.0)
Platelets: 284 10*3/uL (ref 150–400)
RBC: 5.21 MIL/uL (ref 4.22–5.81)
RDW: 13.7 % (ref 11.5–15.5)
WBC: 6.9 10*3/uL (ref 4.0–10.5)

## 2018-05-22 LAB — BASIC METABOLIC PANEL
Anion gap: 11 (ref 5–15)
BUN: 19 mg/dL (ref 8–23)
CO2: 22 mmol/L (ref 22–32)
Calcium: 9.2 mg/dL (ref 8.9–10.3)
Chloride: 108 mmol/L (ref 98–111)
Creatinine, Ser: 1.1 mg/dL (ref 0.61–1.24)
GFR calc Af Amer: >60 mL/min (ref >60)
GFR calc non Af Amer: >60 mL/min (ref >60)
Glucose, Bld: 108 mg/dL — ABNORMAL HIGH (ref 70–99)
Potassium: 4.1 mmol/L (ref 3.5–5.1)
Sodium: 141 mmol/L (ref 135–145)

## 2018-05-22 SURGERY — CYSTOSCOPY/URETEROSCOPY/HOLMIUM LASER/STENT PLACEMENT
Anesthesia: General | Laterality: Left

## 2018-05-22 MED ORDER — LIDOCAINE 2% (20 MG/ML) 5 ML SYRINGE
INTRAMUSCULAR | Status: AC
  Filled 2018-05-22: qty 5

## 2018-05-22 MED ORDER — FENTANYL CITRATE (PF) 100 MCG/2ML IJ SOLN
INTRAMUSCULAR | Status: AC
  Filled 2018-05-22: qty 2

## 2018-05-22 MED ORDER — DEXAMETHASONE SODIUM PHOSPHATE 10 MG/ML IJ SOLN
INTRAMUSCULAR | Status: AC
  Filled 2018-05-22: qty 1

## 2018-05-22 MED ORDER — MIDAZOLAM HCL 5 MG/5ML IJ SOLN
INTRAMUSCULAR | Status: DC | PRN
  Administered 2018-05-22 (×2): 1 mg via INTRAVENOUS

## 2018-05-22 MED ORDER — SODIUM CHLORIDE 0.9 % IR SOLN
Status: DC | PRN
  Administered 2018-05-22: 3000 mL

## 2018-05-22 MED ORDER — ONDANSETRON HCL 4 MG/2ML IJ SOLN
INTRAMUSCULAR | Status: AC
  Filled 2018-05-22: qty 2

## 2018-05-22 MED ORDER — DEXAMETHASONE SODIUM PHOSPHATE 10 MG/ML IJ SOLN
INTRAMUSCULAR | Status: DC | PRN
  Administered 2018-05-22: 10 mg via INTRAVENOUS

## 2018-05-22 MED ORDER — IOHEXOL 300 MG/ML  SOLN
INTRAMUSCULAR | Status: DC | PRN
  Administered 2018-05-22: 10 mL via URETHRAL

## 2018-05-22 MED ORDER — LACTATED RINGERS IV SOLN: INTRAVENOUS | Status: DC

## 2018-05-22 MED ORDER — CIPROFLOXACIN IN D5W 400 MG/200ML IV SOLN
400.0000 mg | Freq: Once | INTRAVENOUS | Status: AC
  Administered 2018-05-22: 400 mg via INTRAVENOUS
  Filled 2018-05-22: qty 200

## 2018-05-22 MED ORDER — LIDOCAINE 2% (20 MG/ML) 5 ML SYRINGE
INTRAMUSCULAR | Status: DC | PRN
  Administered 2018-05-22: 100 mg via INTRAVENOUS

## 2018-05-22 MED ORDER — LACTATED RINGERS IV SOLN
INTRAVENOUS | Status: DC
  Administered 2018-05-22: 16:00:00 via INTRAVENOUS

## 2018-05-22 MED ORDER — PROPOFOL 10 MG/ML IV BOLUS
INTRAVENOUS | Status: DC | PRN
  Administered 2018-05-22: 200 mg via INTRAVENOUS

## 2018-05-22 MED ORDER — MIDAZOLAM HCL 2 MG/2ML IJ SOLN
INTRAMUSCULAR | Status: AC
  Filled 2018-05-22: qty 2

## 2018-05-22 MED ORDER — FENTANYL CITRATE (PF) 100 MCG/2ML IJ SOLN: 25.0000 ug | INTRAMUSCULAR | Status: DC | PRN

## 2018-05-22 MED ORDER — ONDANSETRON HCL 4 MG/2ML IJ SOLN
INTRAMUSCULAR | Status: DC | PRN
  Administered 2018-05-22: 4 mg via INTRAVENOUS

## 2018-05-22 MED ORDER — FENTANYL CITRATE (PF) 100 MCG/2ML IJ SOLN
INTRAMUSCULAR | Status: DC | PRN
  Administered 2018-05-22 (×2): 50 ug via INTRAVENOUS

## 2018-05-22 MED ORDER — MEPERIDINE HCL 50 MG/ML IJ SOLN: 6.2500 mg | INTRAMUSCULAR | Status: DC | PRN

## 2018-05-22 SURGICAL SUPPLY — 19 items
BAG URO CATCHER STRL LF (MISCELLANEOUS) ×3 IMPLANT
BASKET ZERO TIP NITINOL 2.4FR (BASKET) ×3 IMPLANT
CATH INTERMIT  6FR 70CM (CATHETERS) ×3 IMPLANT
CLOTH BEACON ORANGE TIMEOUT ST (SAFETY) ×3 IMPLANT
FIBER LASER FLEXIVA 365 (UROLOGICAL SUPPLIES) ×3 IMPLANT
FIBER LASER TRAC TIP (UROLOGICAL SUPPLIES) IMPLANT
GLOVE BIOGEL M STRL SZ7.5 (GLOVE) ×6 IMPLANT
GOWN STRL REUS W/TWL LRG LVL3 (GOWN DISPOSABLE) ×6 IMPLANT
GUIDEWIRE ANG ZIPWIRE 038X150 (WIRE) IMPLANT
GUIDEWIRE STR DUAL SENSOR (WIRE) ×6 IMPLANT
IV NS 1000ML (IV SOLUTION) ×2
IV NS 1000ML BAXH (IV SOLUTION) ×1 IMPLANT
MANIFOLD NEPTUNE II (INSTRUMENTS) ×3 IMPLANT
PACK CYSTO (CUSTOM PROCEDURE TRAY) ×3 IMPLANT
SHEATH URETERAL 12FRX35CM (MISCELLANEOUS) IMPLANT
STENT CONTOUR 6FRX26X.038 (STENTS) ×3 IMPLANT
TUBING CONNECTING 10 (TUBING) ×2 IMPLANT
TUBING CONNECTING 10' (TUBING) ×1
TUBING UROLOGY SET (TUBING) ×3 IMPLANT

## 2018-05-22 NOTE — Op Note (Signed)
Preoperative diagnosis: Left ureteral calculus  Postoperative diagnosis: Left ureteral calculus  Procedure:  1. Cystoscopy 2. Left ureteroscopy and stone removal 3. Ureteroscopic laser lithotripsy 4. Left ureteral stent placement (6 x 26 with string) 5. Left retrograde pyelography with interpretation  Surgeon: Pryor Curia. M.D.  Anesthesia: General  Complications: None  Intraoperative findings: Left retrograde pyelography demonstrated a filling defect within the distal left ureter consistent with the patient's known calculus without other abnormalities.  EBL: Minimal  Specimens: 1. Left ureteral calculus  Disposition of specimens: [facility] for stone analysis  Indication: Thomas Barnes is a 72 y.o. year old patient with urolithiasis.  He is s/p ESWL but has a remaining distal left ureteral fragment and has been unable to pass it. After reviewing the management options for treatment, the patient elected to proceed with the above surgical procedure(s). We have discussed the potential benefits and risks of the procedure, side effects of the proposed treatment, the likelihood of the patient achieving the goals of the procedure, and any potential problems that might occur during the procedure or recuperation. Informed consent has been obtained.  Description of procedure:  The patient was taken to the operating room and general anesthesia was induced.  The patient was placed in the dorsal lithotomy position, prepped and draped in the usual sterile fashion, and preoperative antibiotics were administered. A preoperative time-out was performed.   Cystourethroscopy was performed.  The patient's urethra was examined and was normal.  The prostate was surgically absent. The bladder was then systematically examined in its entirety. There was no evidence for any bladder tumors, stones, or other mucosal pathology.    Attention then turned to the left ureteral orifice  and a ureteral catheter was used to intubate the ureteral orifice.  Omnipaque contrast was injected through the ureteral catheter and a retrograde pyelogram was performed with findings as dictated above.  A 0.38 sensor guidewire was then advanced up the left ureter into the renal pelvis under fluoroscopic guidance. The 6 Fr semirigid ureteroscope was then advanced into the ureter next to the guidewire and the calculus was identified.   The stone was then fragmented with the 365 micron holmium laser fiber on a setting of 0.6 J and frequency of 6 Hz.   All stones were then removed from the ureter with a zero tip nitinol basket.  Reinspection of the ureter revealed no remaining visible stones or fragments.   The wire was then backloaded through the cystoscope and a ureteral stent was advance over the wire using Seldinger technique.  The stent was positioned appropriately under fluoroscopic and cystoscopic guidance.  The wire was then removed with an adequate stent curl noted in the renal pelvis as well as in the bladder.  The bladder was then emptied and the procedure ended.  The patient appeared to tolerate the procedure well and without complications.  The patient was able to be awakened and transferred to the recovery unit in satisfactory condition.

## 2018-05-22 NOTE — Anesthesia Preprocedure Evaluation (Addendum)
Anesthesia Evaluation  Patient identified by MRN, date of birth, ID band Patient awake    Reviewed: Allergy & Precautions, H&P , NPO status , Patient's Chart, lab work & pertinent test results  Airway Mallampati: III  TM Distance: >3 FB Neck ROM: Full    Dental no notable dental hx. (+) Teeth Intact   Pulmonary sleep apnea ,  Non compliant with CPAP   Pulmonary exam normal breath sounds clear to auscultation       Cardiovascular hypertension, Pt. 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   Abdominal (+) + obese,   Peds  Hematology negative hematology ROS (+)   Anesthesia Other Findings   Reproductive/Obstetrics                             Anesthesia Physical  Anesthesia Plan  ASA: II and emergent  Anesthesia Plan: General   Post-op Pain Management:    Induction: Intravenous  PONV Risk Score and Plan: 2 and Treatment may vary due to age or medical condition, Ondansetron, Dexamethasone and Midazolam  Airway Management Planned: LMA and Oral ETT  Additional Equipment:   Intra-op Plan:   Post-operative Plan: Extubation in OR  Informed Consent: I have reviewed the patients History and Physical, chart, labs and discussed the procedure including the risks, benefits and alternatives for the proposed anesthesia with the patient or authorized representative who has indicated his/her understanding and acceptance.   Dental advisory given  Plan Discussed with: CRNA, Anesthesiologist and Surgeon  Anesthesia Plan Comments:        Anesthesia Quick Evaluation

## 2018-05-22 NOTE — Anesthesia Postprocedure Evaluation (Signed)
Anesthesia Post Note  Patient: Thomas Barnes  Procedure(s) Performed: CYSTOSCOPY/URETEROSCOPY/RETROGRADE/HOLMIUM LASER/STENT PLACEMENT (Left )     Patient location during evaluation: PACU Anesthesia Type: General Level of consciousness: awake and alert Pain management: pain level controlled Vital Signs Assessment: post-procedure vital signs reviewed and stable Respiratory status: spontaneous breathing, nonlabored ventilation, respiratory function stable and patient connected to nasal cannula oxygen Cardiovascular status: blood pressure returned to baseline and stable Postop Assessment: no apparent nausea or vomiting Anesthetic complications: no    Last Vitals:  Vitals:   05/22/18 1845 05/22/18 1900  BP: (!) 123/103 122/79  Pulse: 65 70  Resp: 10 11  Temp:    SpO2: 100% 95%    Last Pain:  Vitals:   05/22/18 1900  TempSrc:   PainSc: 0-No pain                 Tanis Burnley

## 2018-05-22 NOTE — Interval H&P Note (Signed)
History and Physical Interval Note:  05/22/2018 3:27 PM  Thomas Barnes  has presented today for surgery, with the diagnosis of LEFT URETERAL CALCULUS  The various methods of treatment have been discussed with the patient and family. After consideration of risks, benefits and other options for treatment, the patient has consented to  Procedure(s) with comments: CYSTOSCOPY/URETEROSCOPY/RETROGRADE/HOLMIUM LASER/STENT PLACEMENT (Left) - ONLY NEEDS 45 MIN as a surgical intervention .  The patient's history has been reviewed, patient examined, no change in status, stable for surgery.  I have reviewed the patient's chart and labs.  Questions were answered to the patient's satisfaction.     My Rinke,LES

## 2018-05-22 NOTE — Anesthesia Procedure Notes (Signed)
Procedure Name: LMA Insertion Date/Time: 05/22/2018 5:59 PM Performed by: West Pugh, CRNA Pre-anesthesia Checklist: Patient identified, Emergency Drugs available, Suction available, Patient being monitored and Timeout performed Patient Re-evaluated:Patient Re-evaluated prior to induction Oxygen Delivery Method: Circle system utilized Preoxygenation: Pre-oxygenation with 100% oxygen Induction Type: IV induction LMA: LMA inserted LMA Size: 5.0 Number of attempts: 1 Placement Confirmation: positive ETCO2 and breath sounds checked- equal and bilateral Tube secured with: Tape Dental Injury: Teeth and Oropharynx as per pre-operative assessment

## 2018-05-22 NOTE — Discharge Instructions (Addendum)
1. You may see some blood in the urine and may have some burning with urination for 48-72 hours. You also may notice that you have to urinate more frequently or urgently after your procedure which is normal.  2. You should call should you develop an inability urinate, fever > 101, persistent nausea and vomiting that prevents you from eating or drinking to stay hydrated.  3. If you have a stent, you will likely urinate more frequently and urgently until the stent is removed and you may experience some discomfort/pain in the lower abdomen and flank especially when urinating. You may take pain medication prescribed to you if needed for pain. You may also intermittently have blood in the urine until the stent is removed. 4. You may remove your stent on Monday morning.  Simply pull the string that is taped to your body and the stent will easily come out.  This may be best done in the shower as some urine may come out with the stent.  Usually you will feel relief once the stent is removed, but occasionally patients can develop pain due to residual swelling of the ureter that may temporarily obstruct the kidney.  This can be managed by taking pain medication and it will typically resolve with time.  Please do not hesitate to call if you have pain that is not controlled with your pain medication or does not improved within 24-48 hours.

## 2018-05-22 NOTE — Transfer of Care (Signed)
Immediate Anesthesia Transfer of Care Note  Patient: Thomas Barnes  Procedure(s) Performed: CYSTOSCOPY/URETEROSCOPY/RETROGRADE/HOLMIUM LASER/STENT PLACEMENT (Left )  Patient Location: PACU  Anesthesia Type:General  Level of Consciousness: awake, drowsy and patient cooperative  Airway & Oxygen Therapy: Patient Spontanous Breathing and Patient connected to face mask oxygen  Post-op Assessment: Report given to RN and Post -op Vital signs reviewed and stable  Post vital signs: Reviewed and stable  Last Vitals:  Vitals Value Taken Time  BP 130/88 05/22/2018  6:35 PM  Temp    Pulse 68 05/22/2018  6:36 PM  Resp 10 05/22/2018  6:36 PM  SpO2 99 % 05/22/2018  6:36 PM  Vitals shown include unvalidated device data.  Last Pain:  Vitals:   05/22/18 1529  TempSrc: Oral  PainSc: 0-No pain         Complications: No apparent anesthesia complications

## 2018-05-23 ENCOUNTER — Encounter (HOSPITAL_COMMUNITY): Payer: Self-pay | Admitting: Urology

## 2018-06-10 ENCOUNTER — Encounter (HOSPITAL_COMMUNITY): Payer: Self-pay | Admitting: Urology

## 2018-11-10 IMAGING — DX DG ABDOMEN 1V
1 series · 1 of 1 positions shown · non-contrast
Comparison: KUB 03/08/2018.  CT Abdomen and Pelvis 01/17/2018.

CLINICAL DATA: 71-year-old male with left ureteral calculus.

EXAM:
ABDOMEN - 1 VIEW

[abdomen kub]
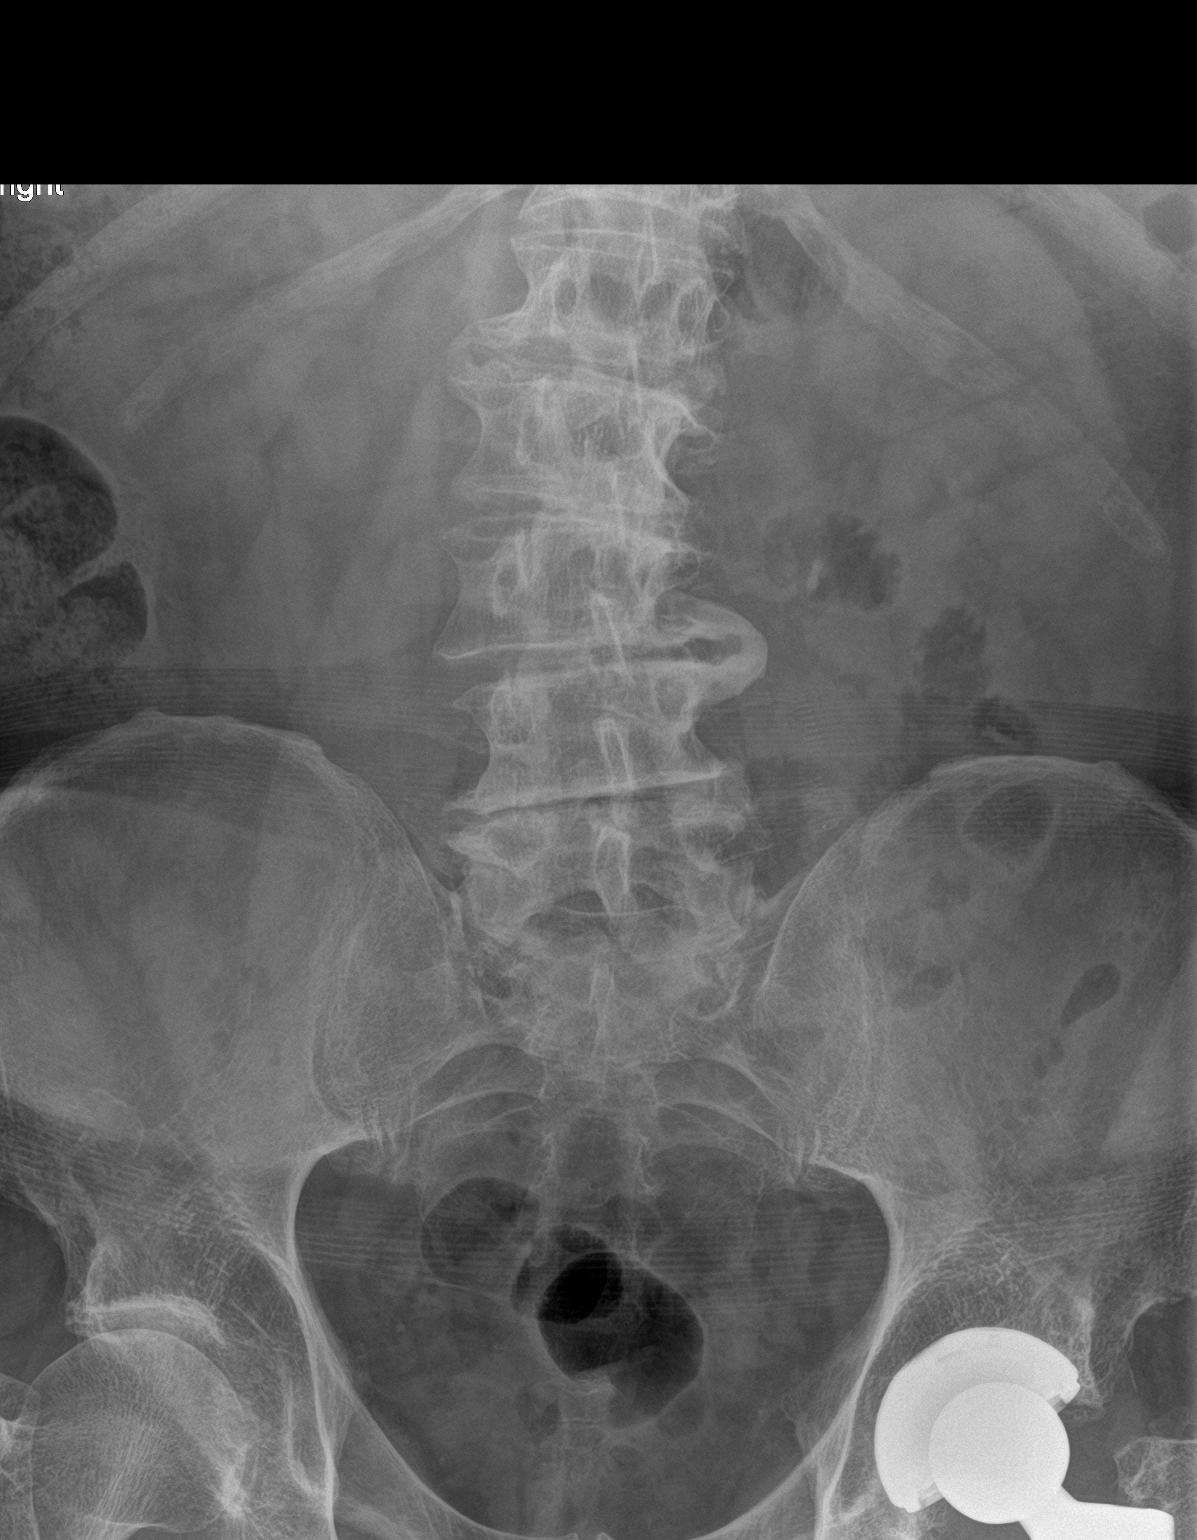

[1 of 1 positions shown; findings below may reference images not displayed]

FINDINGS: Persistent elongated 9-10 millimeter left ureteral calculus which is
unchanged in position [REDACTED] located at the L3 vertebral level.
No other urologic calculus identified.

Abdominal and pelvic visceral contours remain normal. Negative
visible bowel gas pattern. Stable partially visible left hip
arthroplasty and other osseous structures.
IMPRESSION: Unchanged 9-10 millimeter left ureteral calculus since 01/17/2018.

## 2018-11-19 DIAGNOSIS — M1631 Unilateral osteoarthritis resulting from hip dysplasia, right hip: Secondary | ICD-10-CM | POA: Insufficient documentation

## 2019-01-15 DIAGNOSIS — M24159 Other articular cartilage disorders, unspecified hip: Secondary | ICD-10-CM | POA: Insufficient documentation

## 2019-01-15 DIAGNOSIS — M7600 Gluteal tendinitis, unspecified hip: Secondary | ICD-10-CM | POA: Insufficient documentation

## 2019-06-03 ENCOUNTER — Encounter (HOSPITAL_COMMUNITY): Payer: Medicare Other

## 2019-06-11 ENCOUNTER — Inpatient Hospital Stay: Admit: 2019-06-11 | Payer: Medicare Other | Admitting: Orthopedic Surgery

## 2019-06-11 SURGERY — ARTHROPLASTY, HIP, TOTAL, ANTERIOR APPROACH
Anesthesia: Choice | Laterality: Right

## 2019-09-26 ENCOUNTER — Encounter: Payer: Self-pay | Admitting: *Deleted

## 2019-09-29 ENCOUNTER — Encounter: Payer: Self-pay | Admitting: Radiation Oncology

## 2019-09-29 NOTE — Progress Notes (Signed)
GU Location of Tumor / Histology: locally advanced prostate cancer  If Prostate Cancer, Gleason Score is (4 + 5) and PSA is (7.51) pre treatment.   Thomas Barnes had a prostatectomy on 11/22/2017 with negative margins. Unfortunately, his PSA is rising.      Past/Anticipated interventions by urology, if any: prostate biopsy, bone scan (2019), prostatectomy, surveillance, referral for consideration of salvage radiotherapy  Past/Anticipated interventions by medical oncology, if any: no  Weight changes, if any: no  Bowel/Bladder complaints, if any: IPSS 0. SHIM 1. Denies dysuria or hematuria.Reports good continence.    Nausea/Vomiting, if any: no  Pain issues, if any:  Aching in right hip intermittently  SAFETY ISSUES:  Prior radiation? denies  Pacemaker/ICD? denies  Possible current pregnancy? no, male patient  Is the patient on methotrexate? no  Current Complaints / other details:  74 year old male. Married. Ovarian ca - mother. Prostate ca - father.

## 2019-09-30 ENCOUNTER — Encounter: Payer: Self-pay | Admitting: Radiation Oncology

## 2019-09-30 ENCOUNTER — Ambulatory Visit
Admission: RE | Admit: 2019-09-30 | Discharge: 2019-09-30 | Disposition: A | Discharge status: Home / Self Care | Payer: Medicare PPO | Source: Ambulatory Visit | Attending: Radiation Oncology | Admitting: Radiation Oncology

## 2019-09-30 ENCOUNTER — Other Ambulatory Visit: Payer: Self-pay

## 2019-09-30 VITALS — Ht 70.0 in | Wt 220.0 lb

## 2019-09-30 DIAGNOSIS — C61 Malignant neoplasm of prostate: Secondary | ICD-10-CM

## 2019-09-30 NOTE — Progress Notes (Signed)
Radiation Oncology         [phone] ________________________________  Initial outpatient Consultation - Conducted via MyChart due to current COVID-19 concerns for limiting patient exposure  Name: Thomas Barnes MRN: [id number]  Date: 09/30/2019  DOB: [birth year]  [id number], [name], MD  Raynelle Bring, MD   REFERRING PHYSICIAN: Raynelle Bring, MD  DIAGNOSIS: 74 y.o. gentleman with rising, detectable PSA of 0.28 s/p RALP in 11/2017 for Stage pT3a, Gleason 4+5 prostate cancer.    ICD-10-CM   1. Malignant neoplasm of prostate ([place])  C61   2. Prostate cancer [facility])  C61     HISTORY OF PRESENT ILLNESS: Thomas Barnes is a 74 y.o. male with a diagnosis of prostate cancer. He has been followed by Dr. Alinda Money for an elevated PSA since 2017. His initial prostate biopsy in 05/2016 was benign.  His PSA increased from 3.4 in 11/2016 to 7.51 in 06/2017. Repeat PSA performed in 07/2017 confirmed elevation at 6.01, so the patient underwent prostate MRI on 08/29/2017. This revealed an 18 mm focal T2 lesion in the anterior right mid gland to base of peripheral zone (PI-RADS 5); additional vague area in medial right apex of peripheral zone without definite focal lesion, favoring prostatitis (PI-RADS 3); no findings suspicious for extracapsular extension, seminal vesicle invasion, or metastatic disease. He proceeded to an MRI fusion prostate biopsy on 09/27/2017. This time, Gleason 4+4 was seen in three samples from the ROI and one in the right mid lateral. Bone scan performed on 10/10/2017 was negative for definitive osseous metastatic disease.  At that time, he elected to proceed with radical prostatectomy (RALP and BPLND) on 11/22/2017 with Dr. Alinda Money. Final surgical pathology revealed pT3aN0, Gleason 4+5 prostatic adenocarcinoma with focal prostatic extension at the right lateral mid gland but the seminal vesicles, vasa differentia, and all surgical margins were negative along with six negative pelvic lymph nodes  (0/6).  His initial postoperative PSA in 02/2018 was slightly detectable at 0.033 and unfortunately has continued to gradually rise since that time, as below.  Post-op PSA trend: 02/2018 - 0.033 08/2018 - 0.117 11/2018 - 0.109 02/2019 - 0.089 05/2019 - 0.126 08/2019 - 0.28  The patient reviewed the pathology and PSA results with his urologist and he has kindly been referred today for discussion of potential salvage radiation treatment options.  PREVIOUS RADIATION THERAPY: No  PAST MEDICAL HISTORY:  Past Medical History:  Diagnosis Date  . Arthritis   . DDD (degenerative disc disease), cervical    C5-C6  . Elevated PSA   . History of kidney stones   . History of strep sore throat    approx 2 weeks ago   . Hyperlipidemia   . Hypertension   . OSA (obstructive sleep apnea)    per pt study done 2011 --  intolerent CPAP-   . Prostate cancer ([place])       PAST SURGICAL HISTORY: Past Surgical History:  Procedure Laterality Date  . COLONOSCOPY  last one 2013  approx  . CYSTOSCOPY/URETEROSCOPY/HOLMIUM LASER/STENT PLACEMENT Left 05/22/2018   Procedure: CYSTOSCOPY/URETEROSCOPY/RETROGRADE/HOLMIUM LASER/STENT PLACEMENT;  Surgeon: Raynelle Bring, MD;  Location: [facility];  Service: Urology;  Laterality: Left;  ONLY NEEDS 45 MIN  . EXTRACORPOREAL SHOCK WAVE LITHOTRIPSY Left 01/21/2018   Procedure: LEFT EXTRACORPOREAL SHOCK WAVE LITHOTRIPSY (ESWL);  Surgeon: Raynelle Bring, MD;  Location: [facility];  Service: Urology;  Laterality: Left;  . EXTRACORPOREAL SHOCK WAVE LITHOTRIPSY Left 03/18/2018   Procedure: LEFT EXTRACORPOREAL SHOCK WAVE LITHOTRIPSY (ESWL);  Surgeon: Raynelle Bring, MD;  Location: [facility];  Service: Urology;  Laterality: Left;  . LYMPHADENECTOMY Bilateral 11/22/2017   Procedure: LYMPHADENECTOMY, PELVIC;  Surgeon: Raynelle Bring, MD;  Location: [facility];  Service: Urology;  Laterality: Bilateral;  . PROSTATE BIOPSY N/A 05/25/2016   Procedure: BIOPSY TRANSRECTAL ULTRASONIC PROSTATE (TUBP);   Surgeon: Raynelle Bring, MD;  Location: [facility];  Service: Urology;  Laterality: N/A;  . PROSTATE BIOPSY N/A 09/27/2017   Procedure: BIOPSY TRANSRECTAL ULTRASONIC PROSTATE (TUBP);  Surgeon: Raynelle Bring, MD;  Location: [facility];  Service: Urology;  Laterality: N/A;  . ROBOT ASSISTED LAPAROSCOPIC RADICAL PROSTATECTOMY N/A 11/22/2017   Procedure: XI ROBOTIC ASSISTED LAPAROSCOPIC RADICAL PROSTATECTOMY LEVEL 2;  Surgeon: Raynelle Bring, MD;  Location: [facility];  Service: Urology;  Laterality: N/A;  . TONSILLECTOMY  age 71  . TOTAL HIP ARTHROPLASTY Left 06/06/2017   Procedure: LEFT TOTAL HIP ARTHROPLASTY ANTERIOR APPROACH;  Surgeon: Gaynelle Arabian, MD;  Location: [facility];  Service: Orthopedics;  Laterality: Left;  . TRANSRECTAL ULTRASOUND N/A 09/27/2017   Procedure: TRANSRECTAL ULTRASOUND;  Surgeon: Raynelle Bring, MD;  Location: [facility];  Service: Urology;  Laterality: N/A;  ONLY NEEDS 45 MIN FOR PROCEDURE    FAMILY HISTORY:  Family History  Problem Relation Age of Onset  . Ovarian cancer Mother   . Prostate cancer Father   . Heart disease Paternal Grandfather   . Breast cancer Neg Hx   . Colon cancer Neg Hx   . Pancreatic cancer Neg Hx     SOCIAL HISTORY:  Social History   Socioeconomic History  . Marital status: Married    Spouse name: Not on file  . Number of children: Not on file  . Years of education: Not on file  . Highest education level: Not on file  Occupational History  . Not on file  Tobacco Use  . Smoking status: Never Smoker  . Smokeless tobacco: Never Used  Substance and Sexual Activity  . Alcohol use: No  . Drug use: No  . Sexual activity: Not Currently  Other Topics Concern  . Not on file  Social History Narrative  . Not on file   Social Determinants of Health   Financial Resource Strain:   . Difficulty of Paying Living Expenses: Not on file  Food Insecurity:   . Worried About Charity fundraiser in the Last Year: Not on file  . Ran Out of Food  in the Last Year: Not on file  Transportation Needs:   . Lack of Transportation (Medical): Not on file  . Lack of Transportation (Non-Medical): Not on file  Physical Activity:   . Days of Exercise per Week: Not on file  . Minutes of Exercise per Session: Not on file  Stress:   . Feeling of Stress : Not on file  Social Connections:   . Frequency of Communication with Friends and Family: Not on file  . Frequency of Social Gatherings with Friends and Family: Not on file  . Attends Religious Services: Not on file  . Active Member of Clubs or Organizations: Not on file  . Attends Archivist Meetings: Not on file  . Marital Status: Not on file  Intimate Partner Violence:   . Fear of Current or Ex-Partner: Not on file  . Emotionally Abused: Not on file  . Physically Abused: Not on file  . Sexually Abused: Not on file    ALLERGIES: Penicillins  MEDICATIONS:  Current Outpatient Medications  Medication Sig Dispense Refill  . acetaminophen (TYLENOL) 500  MG tablet Take 500 mg by mouth every 6 (six) hours as needed (for pain.).     Marland Kitchen allopurinol (ZYLOPRIM) 300 MG tablet Take 300 mg by mouth every evening.     Marland Kitchen aspirin EC 81 MG tablet Take 81 mg by mouth daily.    Marland Kitchen lisinopril (PRINIVIL,ZESTRIL) 10 MG tablet Take 10 mg by mouth daily.    . Omega-3 Fatty Acids (FISH OIL) 1200 MG CAPS Take 1,200 mg by mouth daily.    . simvastatin (ZOCOR) 40 MG tablet Take 40 mg by mouth daily.     Marland Kitchen aspirin-acetaminophen-caffeine (EXCEDRIN MIGRAINE) 250-250-65 MG tablet Take 1 tablet by mouth every 6 (six) hours as needed for headache.     No current facility-administered medications for this encounter.   Facility-Administered Medications Ordered in Other Encounters  Medication Dose Route Frequency Provider Last Rate Last Admin  . sodium phosphate (FLEET) 7-19 GM/118ML enema 1 enema  1 enema Rectal Once Raynelle Bring, MD        REVIEW OF SYSTEMS:  On review of systems, the patient reports that  he is doing well overall. He denies any chest pain, shortness of breath, cough, fevers, chills, night sweats, unintended weight changes. He denies any bowel disturbances, and denies abdominal pain, nausea or vomiting. He denies any new musculoskeletal or joint aches or pains. His IPSS was 0, indicating no urinary symptoms. He reports good continence and no longer requires pads for protection. 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 A complete review of systems is obtained and is otherwise negative.  PHYSICAL EXAM:  Wt Readings from Last 3 Encounters:  09/30/19 220 lb (99.8 kg)  05/22/18 228 lb (103.4 kg)  01/18/18 220 lb (99.8 kg)   Temp Readings from Last 3 Encounters:  05/22/18 97.7 F (36.5 C)  03/18/18 97.9 F (36.6 C) (Oral)  01/21/18 98.1 F (36.7 C) (Oral)   BP Readings from Last 3 Encounters:  05/22/18 (!) 154/99  03/18/18 124/78  01/21/18 (!) 145/79   Pulse Readings from Last 3 Encounters:  05/22/18 70  03/18/18 63  01/21/18 60   Pain Assessment Pain Score: 1  Pain Frequency: Intermittent Pain Loc: Hip(right hip)/10  In general this is a well appearing Caucasian gentleman in no acute distress. He's alert and oriented x4 and appropriate throughout the examination. Cardiopulmonary assessment is negative for acute distress and he exhibits normal effort.    KPS = 100  100 - Normal; no complaints; no evidence of disease. 90   - Able to carry on normal activity; minor signs or symptoms of disease. 80   - Normal activity with effort; some signs or symptoms of disease. 73   - Cares for self; unable to carry on normal activity or to do active work. 60   - Requires occasional assistance, but is able to care for most of his personal needs. 50   - Requires considerable assistance and frequent medical care. 5   - Disabled; requires special care and assistance. 34   - Severely disabled; hospital admission is indicated although death not  imminent. 60   - Very sick; hospital admission necessary; active supportive treatment necessary. 10   - Moribund; fatal processes progressing rapidly. 0     - Dead  Karnofsky DA, Abelmann WH, Craver LS and Burchenal [facility] [phone]) The use of the nitrogen mustards in the palliative treatment of carcinoma: with particular reference to bronchogenic carcinoma Cancer 1 634-56  LABORATORY DATA:  Lab Results  Component  Value Date   WBC 6.9 05/22/2018   HGB 14.6 05/22/2018   HCT 42.1 05/22/2018   MCV 80.8 05/22/2018   PLT 284 05/22/2018   Lab Results  Component Value Date   NA 141 05/22/2018   K 4.1 05/22/2018   CL 108 05/22/2018   CO2 22 05/22/2018   Lab Results  Component Value Date   ALT 16 (L) 05/30/2017   AST 18 05/30/2017   ALKPHOS 79 05/30/2017   BILITOT 0.6 05/30/2017     RADIOGRAPHY: No results found.    IMPRESSION/PLAN: This visit was conducted via MyChart to spare the patient unnecessary potential exposure in the healthcare setting during the current COVID-19 pandemic. 1. 74 y.o. gentleman with rising, detectable PSA at 0.28 s/p RALP in 11/2017 for Stage pT3a, Gleason 4+5 prostate cancer. Today we reviewed the findings and workup thus far.  We discussed the natural history of prostate cancer.  We reviewed the the implications of positive margins, extracapsular extension, and seminal vesicle involvement on the risk of prostate cancer recurrence, particularly in the setting of detectable, rising postop PSA. We discussed that early salvage can lead to excellent results in terms of improvement in disease control and overall survival. We discussed a 7.5 week course of daily radiation treatment directed to the prostatic fossa with regard to the logistics and delivery of external beam radiation treatment. We also discussed the role of concurrent ADT with salvage radiation therapy and reviewed the side effects to be expected with this therapy. He and his wife were encouraged to ask questions  that were answered to their stated satisfaction.  They appear to have a good understanding of his disease and our recommendations for treatment which are of curative intent.  At the end of the conversation, the patient is interested in moving forward with 7.5 weeks of salvage radiation therapy concurrent with ST-ADT. He has not started ADT. We will share our discussion with Dr. Alinda Money to make arrangements for start of ADT, first available, and proceed with CT Simulation/treatment planning shortly thereafter in anticipation of beginning salvage fossa radiation in the near future.  Given current concerns for patient exposure during the COVID-19 pandemic, this encounter was conducted via video-enabled MyChart visit. The patient has given verbal consent for this type of encounter. The time spent during this encounter was 75 minutes. The attendants for this meeting include Tyler Pita MD, Ashlyn Bruning PA-C, Sisters, patient, SANJEEV RENNA and his wife. During the encounter, Tyler Pita MD, Ashlyn Bruning PA-C, and scribe, Wilburn Mylar were located at [employer].  Patient, Thomas Barnes and his wife were located at home.    Nicholos Johns, PA-C    Tyler Pita, MD  [facility] Direct Dial: [phone]  Fax: [phone] [email or web address]  Skype  [email or web address]  This document serves as a record of services personally performed by Tyler Pita, MD and Freeman Caldron, PA-C. It was created on their behalf by Wilburn Mylar, a trained medical scribe. The creation of this record is based on the scribe's personal observations and the provider's statements to them. This document has been checked and approved by the attending provider.

## 2019-10-02 ENCOUNTER — Telehealth: Payer: Self-pay | Admitting: *Deleted

## 2019-10-02 NOTE — Telephone Encounter (Signed)
RETURNED PATIENT'S PHONE [place], SPOKE [place]

## 2019-10-06 ENCOUNTER — Telehealth: Payer: Self-pay | Admitting: *Deleted

## 2019-10-06 ENCOUNTER — Telehealth: Payer: Self-pay | Admitting: Medical Oncology

## 2019-10-06 NOTE — Telephone Encounter (Signed)
Spoke with patient and his wife to introduce myself as the prostate nurse navigator and discuss my role. I was unable to meet him when he consulted with Dr. Tammi Klippel 2/09. He has prostatectomy  11/2017 and now with rising PSA. He has elected ADT with 7.5 weeks of radiation. He is scheduled for ADT 2/17@1 :30 pm at [facility] and CT simulation 2/23@ 2:30 pm here. He confirmed these appointments. I gave them my contact information and asked him to call with questions or concerns.

## 2019-10-06 NOTE — Telephone Encounter (Signed)
CALLED PATIENT TO INFORM OF SIM APPT. FOR 10-14-19 @ 2:30 PM @ DR. MANNING'S OFFICE, SPOKE WITH PATIENT AND HE IS AWARE OF THIS APPT.

## 2019-10-06 NOTE — Telephone Encounter (Signed)
CALLED PATIENT TO INFORM OF APPT. FOR ADT ON 10-08-19 @ 1:30 PM @ [facility], SPOKE WITH PATIENT'S WIFE- SUSIE AND SHE IS AWARE OF THIS APPT.

## 2019-10-07 ENCOUNTER — Ambulatory Visit: Payer: Medicare Other | Admitting: Radiation Oncology

## 2019-10-13 ENCOUNTER — Encounter: Payer: Self-pay | Admitting: Medical Oncology

## 2019-10-13 ENCOUNTER — Telehealth: Payer: Self-pay | Admitting: *Deleted

## 2019-10-13 ENCOUNTER — Telehealth: Payer: Self-pay | Admitting: Medical Oncology

## 2019-10-13 NOTE — Telephone Encounter (Signed)
Spoke with wife to inform her the CT simulation appointment was cancelled in error. Thomas Barnes has rescheduled for tomorrow at 2:30 pm. She voiced understanding.

## 2019-10-13 NOTE — Telephone Encounter (Signed)
Wife called, concerned that patient's CT simulation appointment was cancelled for tomorrow. I will follow up with Enid Derry and call her back. She voiced understanding.

## 2019-10-13 NOTE — Telephone Encounter (Signed)
CALLED PATIENT TO REMIND OF SIM APPT. FOR 10-14-19 - ARRIVAL TIME- 2:15 PM, SPOKE WITH PATIENT AND HE IS AWARE OF THIS APPT.

## 2019-10-14 ENCOUNTER — Encounter: Payer: Self-pay | Admitting: Medical Oncology

## 2019-10-14 ENCOUNTER — Ambulatory Visit
Admission: RE | Admit: 2019-10-14 | Discharge: 2019-10-14 | Disposition: A | Discharge status: Home / Self Care | Payer: Medicare PPO | Source: Ambulatory Visit | Attending: Radiation Oncology | Admitting: Radiation Oncology

## 2019-10-14 ENCOUNTER — Ambulatory Visit: Payer: Medicare PPO | Admitting: Radiation Oncology

## 2019-10-14 ENCOUNTER — Other Ambulatory Visit: Payer: Self-pay

## 2019-10-14 DIAGNOSIS — C61 Malignant neoplasm of prostate: Secondary | ICD-10-CM

## 2019-10-16 NOTE — Progress Notes (Signed)
  Radiation Oncology         [phone] ________________________________  Name: Thomas Barnes MRN: [id number]  Date: 10/14/2019  DOB: April 05, 1946  SIMULATION AND TREATMENT PLANNING NOTE    ICD-10-CM   1. Prostate cancer [facility]  C61     DIAGNOSIS:  74 y.o. gentleman with rising, detectable PSA of 0.28 s/p RALP in 11/2017 for Stage pT3a, Gleason 4+5 prostate cancer  NARRATIVE:  The patient was brought to the [place].  Identity was confirmed.  All relevant records and images related to the planned course of therapy were reviewed.  The patient freely provided informed written consent to proceed with treatment after reviewing the details related to the planned course of therapy. The consent form was witnessed and verified by the simulation staff.  Then, the patient was set-up in a stable reproducible supine position for radiation therapy.  A vacuum lock pillow device was custom fabricated to position his legs in a reproducible immobilized position.  Then, I performed a urethrogram under sterile conditions to identify the prostatic bed.  CT images were obtained.  Surface markings were placed.  The CT images were loaded into the planning software.  Then the prostate bed target, pelvic lymph node target and avoidance structures including the rectum, bladder, bowel and hips were contoured.  Treatment planning then occurred.  The radiation prescription was entered and confirmed.  A total of one complex treatment devices were fabricated. I have requested : Intensity Modulated Radiotherapy (IMRT) is medically necessary for this case for the following reason:  Rectal sparing.Marland Kitchen  PLAN:  The patient will receive 45 Gy in 25 fractions of 1.8 Gy, followed by a boost to the prostate bed to a total dose of 68.4 Gy with 13 additional fractions of 1.8 Gy.   ________________________________  Sheral Apley Tammi Klippel, M.D.

## 2019-10-21 DIAGNOSIS — R9721 Rising PSA following treatment for malignant neoplasm of prostate: Secondary | ICD-10-CM | POA: Insufficient documentation

## 2019-10-21 DIAGNOSIS — Z510 Encounter for antineoplastic radiation therapy: Secondary | ICD-10-CM | POA: Insufficient documentation

## 2019-10-21 DIAGNOSIS — C61 Malignant neoplasm of prostate: Secondary | ICD-10-CM | POA: Diagnosis not present

## 2019-10-23 ENCOUNTER — Encounter: Payer: Self-pay | Admitting: Medical Oncology

## 2019-10-23 ENCOUNTER — Other Ambulatory Visit: Payer: Self-pay

## 2019-10-23 ENCOUNTER — Ambulatory Visit
Admission: RE | Admit: 2019-10-23 | Discharge: 2019-10-23 | Disposition: A | Discharge status: Home / Self Care | Payer: Medicare PPO | Source: Ambulatory Visit | Attending: Radiation Oncology | Admitting: Radiation Oncology

## 2019-10-23 DIAGNOSIS — Z510 Encounter for antineoplastic radiation therapy: Secondary | ICD-10-CM | POA: Diagnosis not present

## 2019-10-24 ENCOUNTER — Other Ambulatory Visit: Payer: Self-pay

## 2019-10-24 ENCOUNTER — Ambulatory Visit
Admission: RE | Admit: 2019-10-24 | Discharge: 2019-10-24 | Disposition: A | Discharge status: Home / Self Care | Payer: Medicare PPO | Source: Ambulatory Visit | Attending: Radiation Oncology | Admitting: Radiation Oncology

## 2019-10-24 DIAGNOSIS — C61 Malignant neoplasm of prostate: Secondary | ICD-10-CM

## 2019-10-24 DIAGNOSIS — Z510 Encounter for antineoplastic radiation therapy: Secondary | ICD-10-CM | POA: Diagnosis not present

## 2019-10-27 ENCOUNTER — Ambulatory Visit
Admission: RE | Admit: 2019-10-27 | Discharge: 2019-10-27 | Disposition: A | Discharge status: Home / Self Care | Payer: Medicare PPO | Source: Ambulatory Visit | Attending: Radiation Oncology | Admitting: Radiation Oncology

## 2019-10-27 ENCOUNTER — Other Ambulatory Visit: Payer: Self-pay

## 2019-10-27 DIAGNOSIS — Z510 Encounter for antineoplastic radiation therapy: Secondary | ICD-10-CM | POA: Diagnosis not present

## 2019-10-28 ENCOUNTER — Ambulatory Visit
Admission: RE | Admit: 2019-10-28 | Discharge: 2019-10-28 | Disposition: A | Discharge status: Home / Self Care | Payer: Medicare PPO | Source: Ambulatory Visit | Attending: Radiation Oncology | Admitting: Radiation Oncology

## 2019-10-28 ENCOUNTER — Other Ambulatory Visit: Payer: Self-pay

## 2019-10-28 DIAGNOSIS — Z510 Encounter for antineoplastic radiation therapy: Secondary | ICD-10-CM | POA: Diagnosis not present

## 2019-10-29 ENCOUNTER — Ambulatory Visit
Admission: RE | Admit: 2019-10-29 | Discharge: 2019-10-29 | Disposition: A | Discharge status: Home / Self Care | Payer: Medicare PPO | Source: Ambulatory Visit | Attending: Radiation Oncology | Admitting: Radiation Oncology

## 2019-10-29 ENCOUNTER — Other Ambulatory Visit: Payer: Self-pay

## 2019-10-29 DIAGNOSIS — Z510 Encounter for antineoplastic radiation therapy: Secondary | ICD-10-CM | POA: Diagnosis not present

## 2019-10-30 ENCOUNTER — Ambulatory Visit
Admission: RE | Admit: 2019-10-30 | Discharge: 2019-10-30 | Disposition: A | Discharge status: Home / Self Care | Payer: Medicare PPO | Source: Ambulatory Visit | Attending: Radiation Oncology | Admitting: Radiation Oncology

## 2019-10-30 DIAGNOSIS — Z510 Encounter for antineoplastic radiation therapy: Secondary | ICD-10-CM | POA: Diagnosis not present

## 2019-10-31 ENCOUNTER — Ambulatory Visit
Admission: RE | Admit: 2019-10-31 | Discharge: 2019-10-31 | Disposition: A | Discharge status: Home / Self Care | Payer: Medicare PPO | Source: Ambulatory Visit | Attending: Radiation Oncology | Admitting: Radiation Oncology

## 2019-10-31 ENCOUNTER — Other Ambulatory Visit: Payer: Self-pay

## 2019-10-31 DIAGNOSIS — Z510 Encounter for antineoplastic radiation therapy: Secondary | ICD-10-CM | POA: Diagnosis not present

## 2019-11-03 ENCOUNTER — Other Ambulatory Visit: Payer: Self-pay

## 2019-11-03 ENCOUNTER — Ambulatory Visit
Admission: RE | Admit: 2019-11-03 | Discharge: 2019-11-03 | Disposition: A | Discharge status: Home / Self Care | Payer: Medicare PPO | Source: Ambulatory Visit | Attending: Radiation Oncology | Admitting: Radiation Oncology

## 2019-11-03 DIAGNOSIS — Z510 Encounter for antineoplastic radiation therapy: Secondary | ICD-10-CM | POA: Diagnosis not present

## 2019-11-04 ENCOUNTER — Ambulatory Visit
Admission: RE | Admit: 2019-11-04 | Discharge: 2019-11-04 | Disposition: A | Discharge status: Home / Self Care | Payer: Medicare PPO | Source: Ambulatory Visit | Attending: Radiation Oncology | Admitting: Radiation Oncology

## 2019-11-04 ENCOUNTER — Other Ambulatory Visit: Payer: Self-pay

## 2019-11-04 DIAGNOSIS — Z510 Encounter for antineoplastic radiation therapy: Secondary | ICD-10-CM | POA: Diagnosis not present

## 2019-11-05 ENCOUNTER — Other Ambulatory Visit: Payer: Self-pay

## 2019-11-05 ENCOUNTER — Ambulatory Visit
Admission: RE | Admit: 2019-11-05 | Discharge: 2019-11-05 | Disposition: A | Discharge status: Home / Self Care | Payer: Medicare PPO | Source: Ambulatory Visit | Attending: Radiation Oncology | Admitting: Radiation Oncology

## 2019-11-05 DIAGNOSIS — Z510 Encounter for antineoplastic radiation therapy: Secondary | ICD-10-CM | POA: Diagnosis not present

## 2019-11-06 ENCOUNTER — Other Ambulatory Visit: Payer: Self-pay

## 2019-11-06 ENCOUNTER — Ambulatory Visit
Admission: RE | Admit: 2019-11-06 | Discharge: 2019-11-06 | Disposition: A | Discharge status: Home / Self Care | Payer: Medicare PPO | Source: Ambulatory Visit | Attending: Radiation Oncology | Admitting: Radiation Oncology

## 2019-11-06 DIAGNOSIS — Z510 Encounter for antineoplastic radiation therapy: Secondary | ICD-10-CM | POA: Diagnosis not present

## 2019-11-07 ENCOUNTER — Ambulatory Visit
Admission: RE | Admit: 2019-11-07 | Discharge: 2019-11-07 | Disposition: A | Discharge status: Home / Self Care | Payer: Medicare PPO | Source: Ambulatory Visit | Attending: Radiation Oncology | Admitting: Radiation Oncology

## 2019-11-07 ENCOUNTER — Other Ambulatory Visit: Payer: Self-pay

## 2019-11-07 DIAGNOSIS — Z510 Encounter for antineoplastic radiation therapy: Secondary | ICD-10-CM | POA: Diagnosis not present

## 2019-11-10 ENCOUNTER — Ambulatory Visit
Admission: RE | Admit: 2019-11-10 | Discharge: 2019-11-10 | Disposition: A | Discharge status: Home / Self Care | Payer: Medicare PPO | Source: Ambulatory Visit | Attending: Radiation Oncology | Admitting: Radiation Oncology

## 2019-11-10 ENCOUNTER — Other Ambulatory Visit: Payer: Self-pay

## 2019-11-10 DIAGNOSIS — Z510 Encounter for antineoplastic radiation therapy: Secondary | ICD-10-CM | POA: Diagnosis not present

## 2019-11-11 ENCOUNTER — Ambulatory Visit
Admission: RE | Admit: 2019-11-11 | Discharge: 2019-11-11 | Disposition: A | Discharge status: Home / Self Care | Payer: Medicare PPO | Source: Ambulatory Visit | Attending: Radiation Oncology | Admitting: Radiation Oncology

## 2019-11-11 ENCOUNTER — Other Ambulatory Visit: Payer: Self-pay

## 2019-11-11 DIAGNOSIS — Z510 Encounter for antineoplastic radiation therapy: Secondary | ICD-10-CM | POA: Diagnosis not present

## 2019-11-12 ENCOUNTER — Ambulatory Visit
Admission: RE | Admit: 2019-11-12 | Discharge: 2019-11-12 | Disposition: A | Discharge status: Home / Self Care | Payer: Medicare PPO | Source: Ambulatory Visit | Attending: Radiation Oncology | Admitting: Radiation Oncology

## 2019-11-12 ENCOUNTER — Other Ambulatory Visit: Payer: Self-pay

## 2019-11-12 DIAGNOSIS — Z510 Encounter for antineoplastic radiation therapy: Secondary | ICD-10-CM | POA: Diagnosis not present

## 2019-11-13 ENCOUNTER — Other Ambulatory Visit: Payer: Self-pay

## 2019-11-13 ENCOUNTER — Ambulatory Visit
Admission: RE | Admit: 2019-11-13 | Discharge: 2019-11-13 | Disposition: A | Discharge status: Home / Self Care | Payer: Medicare PPO | Source: Ambulatory Visit | Attending: Radiation Oncology | Admitting: Radiation Oncology

## 2019-11-13 DIAGNOSIS — Z510 Encounter for antineoplastic radiation therapy: Secondary | ICD-10-CM | POA: Diagnosis not present

## 2019-11-14 ENCOUNTER — Ambulatory Visit
Admission: RE | Admit: 2019-11-14 | Discharge: 2019-11-14 | Disposition: A | Discharge status: Home / Self Care | Payer: Medicare PPO | Source: Ambulatory Visit | Attending: Radiation Oncology | Admitting: Radiation Oncology

## 2019-11-14 ENCOUNTER — Other Ambulatory Visit: Payer: Self-pay | Admitting: Radiation Oncology

## 2019-11-14 ENCOUNTER — Other Ambulatory Visit: Payer: Self-pay

## 2019-11-14 DIAGNOSIS — Z510 Encounter for antineoplastic radiation therapy: Secondary | ICD-10-CM | POA: Diagnosis not present

## 2019-11-14 MED ORDER — VENLAFAXINE HCL ER 37.5 MG PO CP24: 37.5000 mg | ORAL_CAPSULE | Freq: Every day | ORAL | 5 refills | Status: DC

## 2019-11-14 NOTE — Progress Notes (Signed)
Starting Effexor for hot flashes.

## 2019-11-17 ENCOUNTER — Ambulatory Visit
Admission: RE | Admit: 2019-11-17 | Discharge: 2019-11-17 | Disposition: A | Discharge status: Home / Self Care | Payer: Medicare PPO | Source: Ambulatory Visit | Attending: Radiation Oncology | Admitting: Radiation Oncology

## 2019-11-17 ENCOUNTER — Other Ambulatory Visit: Payer: Self-pay

## 2019-11-17 DIAGNOSIS — Z510 Encounter for antineoplastic radiation therapy: Secondary | ICD-10-CM | POA: Diagnosis not present

## 2019-11-18 ENCOUNTER — Ambulatory Visit
Admission: RE | Admit: 2019-11-18 | Discharge: 2019-11-18 | Disposition: A | Discharge status: Home / Self Care | Payer: Medicare PPO | Source: Ambulatory Visit | Attending: Radiation Oncology | Admitting: Radiation Oncology

## 2019-11-18 ENCOUNTER — Other Ambulatory Visit: Payer: Self-pay

## 2019-11-18 DIAGNOSIS — Z510 Encounter for antineoplastic radiation therapy: Secondary | ICD-10-CM | POA: Diagnosis not present

## 2019-11-19 ENCOUNTER — Other Ambulatory Visit: Payer: Self-pay

## 2019-11-19 ENCOUNTER — Ambulatory Visit
Admission: RE | Admit: 2019-11-19 | Discharge: 2019-11-19 | Disposition: A | Discharge status: Home / Self Care | Payer: Medicare PPO | Source: Ambulatory Visit | Attending: Radiation Oncology | Admitting: Radiation Oncology

## 2019-11-19 DIAGNOSIS — Z510 Encounter for antineoplastic radiation therapy: Secondary | ICD-10-CM | POA: Diagnosis not present

## 2019-11-20 ENCOUNTER — Other Ambulatory Visit: Payer: Self-pay

## 2019-11-20 ENCOUNTER — Ambulatory Visit
Admission: RE | Admit: 2019-11-20 | Discharge: 2019-11-20 | Disposition: A | Discharge status: Home / Self Care | Payer: Medicare PPO | Source: Ambulatory Visit | Attending: Radiation Oncology | Admitting: Radiation Oncology

## 2019-11-20 DIAGNOSIS — Z510 Encounter for antineoplastic radiation therapy: Secondary | ICD-10-CM | POA: Insufficient documentation

## 2019-11-20 DIAGNOSIS — R9721 Rising PSA following treatment for malignant neoplasm of prostate: Secondary | ICD-10-CM | POA: Insufficient documentation

## 2019-11-20 DIAGNOSIS — C61 Malignant neoplasm of prostate: Secondary | ICD-10-CM | POA: Insufficient documentation

## 2019-11-21 ENCOUNTER — Ambulatory Visit
Admission: RE | Admit: 2019-11-21 | Discharge: 2019-11-21 | Disposition: A | Discharge status: Home / Self Care | Payer: Medicare PPO | Source: Ambulatory Visit | Attending: Radiation Oncology | Admitting: Radiation Oncology

## 2019-11-21 ENCOUNTER — Other Ambulatory Visit: Payer: Self-pay

## 2019-11-21 DIAGNOSIS — Z510 Encounter for antineoplastic radiation therapy: Secondary | ICD-10-CM | POA: Diagnosis not present

## 2019-11-24 ENCOUNTER — Ambulatory Visit
Admission: RE | Admit: 2019-11-24 | Discharge: 2019-11-24 | Disposition: A | Discharge status: Home / Self Care | Payer: Medicare PPO | Source: Ambulatory Visit | Attending: Radiation Oncology | Admitting: Radiation Oncology

## 2019-11-24 ENCOUNTER — Other Ambulatory Visit: Payer: Self-pay

## 2019-11-24 DIAGNOSIS — Z510 Encounter for antineoplastic radiation therapy: Secondary | ICD-10-CM | POA: Diagnosis not present

## 2019-11-25 ENCOUNTER — Other Ambulatory Visit: Payer: Self-pay | Admitting: Radiation Oncology

## 2019-11-25 ENCOUNTER — Other Ambulatory Visit: Payer: Self-pay

## 2019-11-25 ENCOUNTER — Ambulatory Visit
Admission: RE | Admit: 2019-11-25 | Discharge: 2019-11-25 | Disposition: A | Discharge status: Home / Self Care | Payer: Medicare PPO | Source: Ambulatory Visit | Attending: Radiation Oncology | Admitting: Radiation Oncology

## 2019-11-25 DIAGNOSIS — Z510 Encounter for antineoplastic radiation therapy: Secondary | ICD-10-CM | POA: Diagnosis not present

## 2019-11-25 MED ORDER — MEGESTROL ACETATE 20 MG PO TABS: 20.0000 mg | ORAL_TABLET | Freq: Two times a day (BID) | ORAL | 5 refills | Status: DC

## 2019-11-26 ENCOUNTER — Ambulatory Visit
Admission: RE | Admit: 2019-11-26 | Discharge: 2019-11-26 | Disposition: A | Discharge status: Home / Self Care | Payer: Medicare PPO | Source: Ambulatory Visit | Attending: Radiation Oncology | Admitting: Radiation Oncology

## 2019-11-26 ENCOUNTER — Other Ambulatory Visit: Payer: Self-pay

## 2019-11-26 DIAGNOSIS — Z510 Encounter for antineoplastic radiation therapy: Secondary | ICD-10-CM | POA: Diagnosis not present

## 2019-11-27 ENCOUNTER — Ambulatory Visit
Admission: RE | Admit: 2019-11-27 | Discharge: 2019-11-27 | Disposition: A | Discharge status: Home / Self Care | Payer: Medicare PPO | Source: Ambulatory Visit | Attending: Radiation Oncology | Admitting: Radiation Oncology

## 2019-11-27 ENCOUNTER — Other Ambulatory Visit: Payer: Self-pay

## 2019-11-27 DIAGNOSIS — Z510 Encounter for antineoplastic radiation therapy: Secondary | ICD-10-CM | POA: Diagnosis not present

## 2019-11-28 ENCOUNTER — Ambulatory Visit
Admission: RE | Admit: 2019-11-28 | Discharge: 2019-11-28 | Disposition: A | Discharge status: Home / Self Care | Payer: Medicare PPO | Source: Ambulatory Visit | Attending: Radiation Oncology | Admitting: Radiation Oncology

## 2019-11-28 ENCOUNTER — Other Ambulatory Visit: Payer: Self-pay

## 2019-11-28 DIAGNOSIS — Z510 Encounter for antineoplastic radiation therapy: Secondary | ICD-10-CM | POA: Diagnosis not present

## 2019-11-28 DIAGNOSIS — C61 Malignant neoplasm of prostate: Secondary | ICD-10-CM

## 2019-12-01 ENCOUNTER — Other Ambulatory Visit: Payer: Self-pay

## 2019-12-01 ENCOUNTER — Ambulatory Visit
Admission: RE | Admit: 2019-12-01 | Discharge: 2019-12-01 | Disposition: A | Discharge status: Home / Self Care | Payer: Medicare PPO | Source: Ambulatory Visit | Attending: Radiation Oncology | Admitting: Radiation Oncology

## 2019-12-01 DIAGNOSIS — Z510 Encounter for antineoplastic radiation therapy: Secondary | ICD-10-CM | POA: Diagnosis not present

## 2019-12-01 NOTE — Progress Notes (Signed)
  Radiation Oncology         [phone] ________________________________  Name: Thomas Barnes MRN: [id number]  Date: 11/28/2019  DOB: 14-Apr-1946  COMPLEX SIMULATION NOTE    ICD-10-CM   1. Prostate cancer [facility])  C61     DIAGNOSIS:  74 y.o. gentleman with rising, detectable PSA of 0.28 s/p RALP in 11/2017 for Stage pT3a, Gleason 4+5 prostate cancer  NARRATIVE: The patient is currently receiving radiation treatment to the pelvic region.  His initial planning CT revealed some gas in the rectum and increased rectal filling.  Initially, this was consistent with his daily treatment imaging set up.  However, over the past few weeks, the patient has experienced a more empty rectum on his treatment imaging.  As result, his initial planning does not seem to match his current set up.  This could result in under dosage of the target region or overdosage of organs at risk, including the rectum.  Based on this change in the patient's anatomy, we elected to proceed with replanning. The patient was brought to the [place].  Identity was confirmed.  All relevant records and images related to the planned course of therapy were reviewed.   Then, the patient was set-up in his immobilization device and position for radiation therapy.  CT images were obtained.  Surface markings were placed.  The CT images were loaded into the planning software.  Then the target and avoidance structures were contoured.  Treatment planning then occurred.  The radiation prescription was entered and confirmed.   I have requested : Isodose Plan.   PLAN:  The patient will continue his planned course of radiation therapy with modified dosimetry.  ________________________________  Sheral Apley Tammi Klippel, M.D.

## 2019-12-02 ENCOUNTER — Ambulatory Visit
Admission: RE | Admit: 2019-12-02 | Discharge: 2019-12-02 | Disposition: A | Discharge status: Home / Self Care | Payer: Medicare PPO | Source: Ambulatory Visit | Attending: Radiation Oncology | Admitting: Radiation Oncology

## 2019-12-02 ENCOUNTER — Other Ambulatory Visit: Payer: Self-pay

## 2019-12-02 DIAGNOSIS — Z510 Encounter for antineoplastic radiation therapy: Secondary | ICD-10-CM | POA: Diagnosis not present

## 2019-12-03 ENCOUNTER — Other Ambulatory Visit: Payer: Self-pay

## 2019-12-03 ENCOUNTER — Ambulatory Visit
Admission: RE | Admit: 2019-12-03 | Discharge: 2019-12-03 | Disposition: A | Discharge status: Home / Self Care | Payer: Medicare PPO | Source: Ambulatory Visit | Attending: Radiation Oncology | Admitting: Radiation Oncology

## 2019-12-03 DIAGNOSIS — Z510 Encounter for antineoplastic radiation therapy: Secondary | ICD-10-CM | POA: Diagnosis not present

## 2019-12-04 ENCOUNTER — Other Ambulatory Visit: Payer: Self-pay

## 2019-12-04 ENCOUNTER — Ambulatory Visit
Admission: RE | Admit: 2019-12-04 | Discharge: 2019-12-04 | Disposition: A | Discharge status: Home / Self Care | Payer: Medicare PPO | Source: Ambulatory Visit | Attending: Radiation Oncology | Admitting: Radiation Oncology

## 2019-12-04 DIAGNOSIS — Z510 Encounter for antineoplastic radiation therapy: Secondary | ICD-10-CM | POA: Diagnosis not present

## 2019-12-05 ENCOUNTER — Other Ambulatory Visit: Payer: Self-pay

## 2019-12-05 ENCOUNTER — Ambulatory Visit
Admission: RE | Admit: 2019-12-05 | Discharge: 2019-12-05 | Disposition: A | Discharge status: Home / Self Care | Payer: Medicare PPO | Source: Ambulatory Visit | Attending: Radiation Oncology | Admitting: Radiation Oncology

## 2019-12-05 DIAGNOSIS — Z510 Encounter for antineoplastic radiation therapy: Secondary | ICD-10-CM | POA: Diagnosis not present

## 2019-12-08 ENCOUNTER — Ambulatory Visit
Admission: RE | Admit: 2019-12-08 | Discharge: 2019-12-08 | Disposition: A | Discharge status: Home / Self Care | Payer: Medicare PPO | Source: Ambulatory Visit | Attending: Radiation Oncology | Admitting: Radiation Oncology

## 2019-12-08 ENCOUNTER — Other Ambulatory Visit: Payer: Self-pay

## 2019-12-08 DIAGNOSIS — Z510 Encounter for antineoplastic radiation therapy: Secondary | ICD-10-CM | POA: Diagnosis not present

## 2019-12-09 ENCOUNTER — Ambulatory Visit
Admission: RE | Admit: 2019-12-09 | Discharge: 2019-12-09 | Disposition: A | Discharge status: Home / Self Care | Payer: Medicare PPO | Source: Ambulatory Visit | Attending: Radiation Oncology | Admitting: Radiation Oncology

## 2019-12-09 ENCOUNTER — Other Ambulatory Visit: Payer: Self-pay

## 2019-12-09 DIAGNOSIS — Z510 Encounter for antineoplastic radiation therapy: Secondary | ICD-10-CM | POA: Diagnosis not present

## 2019-12-10 ENCOUNTER — Ambulatory Visit
Admission: RE | Admit: 2019-12-10 | Discharge: 2019-12-10 | Disposition: A | Discharge status: Home / Self Care | Payer: Medicare PPO | Source: Ambulatory Visit | Attending: Radiation Oncology | Admitting: Radiation Oncology

## 2019-12-10 ENCOUNTER — Other Ambulatory Visit: Payer: Self-pay

## 2019-12-10 DIAGNOSIS — Z510 Encounter for antineoplastic radiation therapy: Secondary | ICD-10-CM | POA: Diagnosis not present

## 2019-12-11 ENCOUNTER — Other Ambulatory Visit: Payer: Self-pay

## 2019-12-11 ENCOUNTER — Ambulatory Visit
Admission: RE | Admit: 2019-12-11 | Discharge: 2019-12-11 | Disposition: A | Discharge status: Home / Self Care | Payer: Medicare PPO | Source: Ambulatory Visit | Attending: Radiation Oncology | Admitting: Radiation Oncology

## 2019-12-11 DIAGNOSIS — Z510 Encounter for antineoplastic radiation therapy: Secondary | ICD-10-CM | POA: Diagnosis not present

## 2019-12-12 ENCOUNTER — Other Ambulatory Visit: Payer: Self-pay

## 2019-12-12 ENCOUNTER — Ambulatory Visit
Admission: RE | Admit: 2019-12-12 | Discharge: 2019-12-12 | Disposition: A | Discharge status: Home / Self Care | Payer: Medicare PPO | Source: Ambulatory Visit | Attending: Radiation Oncology | Admitting: Radiation Oncology

## 2019-12-12 DIAGNOSIS — Z510 Encounter for antineoplastic radiation therapy: Secondary | ICD-10-CM | POA: Diagnosis not present

## 2019-12-15 ENCOUNTER — Encounter: Payer: Self-pay | Admitting: Medical Oncology

## 2019-12-15 ENCOUNTER — Encounter: Payer: Self-pay | Admitting: Urology

## 2019-12-15 ENCOUNTER — Other Ambulatory Visit: Payer: Self-pay

## 2019-12-15 ENCOUNTER — Ambulatory Visit
Admission: RE | Admit: 2019-12-15 | Discharge: 2019-12-15 | Disposition: A | Discharge status: Home / Self Care | Payer: Medicare PPO | Source: Ambulatory Visit | Attending: Radiation Oncology | Admitting: Radiation Oncology

## 2019-12-15 DIAGNOSIS — Z510 Encounter for antineoplastic radiation therapy: Secondary | ICD-10-CM | POA: Diagnosis not present

## 2020-01-15 ENCOUNTER — Ambulatory Visit: Payer: Self-pay | Admitting: Urology

## 2020-01-20 ENCOUNTER — Telehealth: Payer: Self-pay

## 2020-01-20 NOTE — Telephone Encounter (Signed)
Spoke with wife which is on list for him verbalized she understood it is a telephone encounter

## 2020-01-20 NOTE — Progress Notes (Signed)
Follow up prostate patient denies any hematuria or dysuria, leakage,  Loose stools or diarrhea. Feels he always empty his bladder and has nocturia x3. Follow up appointment scheduled with urologist.

## 2020-01-21 ENCOUNTER — Other Ambulatory Visit: Payer: Self-pay

## 2020-01-21 ENCOUNTER — Encounter: Payer: Self-pay | Admitting: Urology

## 2020-01-21 ENCOUNTER — Ambulatory Visit
Admission: RE | Admit: 2020-01-21 | Discharge: 2020-01-21 | Disposition: A | Discharge status: Home / Self Care | Payer: Medicare PPO | Source: Ambulatory Visit | Attending: Urology | Admitting: Urology

## 2020-01-21 DIAGNOSIS — C61 Malignant neoplasm of prostate: Secondary | ICD-10-CM

## 2020-01-21 NOTE — Addendum Note (Signed)
Encounter addended by: Freeman Caldron, PA-C on: 01/21/2020 1:30 PM  Actions taken: Clinical Note Signed, Level of Service modified

## 2020-01-21 NOTE — Progress Notes (Signed)
Patient reports no hematuria or dysuria at this time. Reports doing well. Urine stream is strong no difficulty with starting urine stream. Nocturia x3. Urology consult scheduled for March 24, 2020

## 2020-01-21 NOTE — Progress Notes (Signed)
Radiation Oncology         [phone] ________________________________  Name: Thomas Barnes MRN: [id number]  Date: 01/21/2020  DOB: 1946-04-19  Post Treatment Note  CC: Thomas Baton, MD  Thomas Bring, MD  Diagnosis:   74 y.o. gentleman with rising, detectable PSA of 0.28 s/p RALP in 11/2017 for Stage pT3a, Gleason 4+5 prostate cancer.  Interval Since Last Radiation:  5 weeks  10/23/19 - 12/15/19: (concurrent with ADT) 1. The prostate fossa and pelvic lymph nodes were initially treated to 45 Gy in 25 fractions of 1.8 Gy  2. The prostate fossa only was boosted to 68.4 Gy with 13 additional fractions of 1.8 Gy    Narrative:  I spoke with the patient to conduct his routine scheduled 1 month follow up visit via telephone to spare the patient unnecessary potential exposure in the healthcare setting during the current COVID-19 pandemic.  The patient was notified in advance and gave permission to proceed with this visit format. He tolerated radiation treatment relatively well.   The patient experienced some minor urinary irritation with nocturia x3 and modest fatigue and hot flashes associated with ADT.                               On review of systems, the patient states that he is doing very well overall.  His current IPSS score is 8, indicating mild urinary symptoms with nocturia x3/night and occasional feelings of incomplete bladder emptying although he reports that he is pretty much feeling back to his baseline at this point.  He specifically denies dysuria, gross hematuria, straining to void, excessive daytime frequency, urgency, or incontinence.  He reports a healthy appetite but has lost a couple of pounds since he completed radiation.  He denies abdominal pain, nausea, vomiting, diarrhea or constipation.  He continues with fatigue and periodic hot flashes associated with the ADT.  He did have some rather significant decreased stamina for approximately 3 weeks after completing radiation but  reports that this has improved greatly.  Overall, he is quite pleased with his progress to date.  ALLERGIES:  is allergic to penicillins.  Meds: Current Outpatient Medications  Medication Sig Dispense Refill  . acetaminophen (TYLENOL) 500 MG tablet Take 500 mg by mouth every 6 (six) hours as needed (for pain.).     Marland Kitchen allopurinol (ZYLOPRIM) 300 MG tablet Take 300 mg by mouth every evening.     Marland Kitchen aspirin EC 81 MG tablet Take 81 mg by mouth daily.    Marland Kitchen aspirin-acetaminophen-caffeine (EXCEDRIN MIGRAINE) 250-250-65 MG tablet Take 1 tablet by mouth every 6 (six) hours as needed for headache.    . lisinopril (PRINIVIL,ZESTRIL) 10 MG tablet Take 10 mg by mouth daily.    . megestrol (MEGACE) 20 MG tablet Take 1 tablet (20 mg total) by mouth 2 (two) times daily. 60 tablet 5  . Omega-3 Fatty Acids (FISH OIL) 1200 MG CAPS Take 1,200 mg by mouth daily.    . simvastatin (ZOCOR) 40 MG tablet Take 40 mg by mouth daily.      No current facility-administered medications for this encounter.   Facility-Administered Medications Ordered in Other Encounters  Medication Dose Route Frequency Provider Last Rate Last Admin  . sodium phosphate (FLEET) 7-19 GM/118ML enema 1 enema  1 enema Rectal Once Thomas Bring, MD        Physical Findings:  vitals were not taken for this visit.   Thomas Barnes  to assess due to telephone follow up visit format.  Lab Findings: Lab Results  Component Value Date   WBC 6.9 05/22/2018   HGB 14.6 05/22/2018   HCT 42.1 05/22/2018   MCV 80.8 05/22/2018   PLT 284 05/22/2018     Radiographic Findings: No results found.  Impression/Plan: 1. 74 y.o. gentleman with rising, detectable PSA of 0.28 s/p RALP in 11/2017 for Stage pT3a, Gleason 4+5 prostate cancer. He will continue to follow up with urology for ongoing PSA determinations and has an appointment scheduled with Dr. Alinda Barnes on March 24, 2020. He understands what to expect with regards to PSA monitoring going forward. I will look  forward to following his response to treatment via correspondence with urology, and would be happy to continue to participate in his care if clinically indicated. I talked to the patient about what to expect in the future, including his risk for erectile dysfunction and rectal bleeding. I encouraged him to call or return to the office if he has any questions regarding his previous radiation or possible radiation side effects. He was comfortable with this plan and will follow up as needed.  Today, a comprehensive survivorship care plan and treatment summary was reviewed with the patient today detailing his prostate cancer diagnosis, treatment course, potential late/long-term effects of treatment, appropriate follow-up care with recommendations for the future, and patient education resources.  A copy of this summary, along with a letter will be sent to the patient's primary care provider via In Basket message after today's visit.  2. Cancer screening:  Due to Thomas Barnes history and his age, he should receive screening for skin cancers and colon cancer.  The information and recommendations are listed on the patient's comprehensive care plan/treatment summary and were reviewed in detail with the patient.     3. Health maintenance and wellness promotion: Thomas Barnes was encouraged to consume 5-7 servings of fruits and vegetables per day. He was provided a copy of the "Nutrition Rainbow" handout, as well as the handout "Take Control of Your Health and Thomas Barnes" from the [place].  He was also encouraged to engage in moderate to vigorous exercise for 30 minutes per day most days of the week. Information was provided regarding the [facility] fitness program, which is designed for cancer survivors to help them become more physically fit after cancer treatments. We discussed that a healthy BMI is 18.5-24.9 and that maintaining a healthy weight reduces risk of cancer recurrences.  He  was instructed to limit his alcohol consumption and continue to abstain from tobacco use.  Lastly, he was encouraged to use sunscreen and wear protective clothing when in the sun.     4. Support services/counseling: It is not uncommon for this period of the patient's cancer care trajectory to be one of many emotions and stressors.  Mr. Pearsall was encouraged to take advantage of our many support services programs, support groups, and/or counseling in coping with his new life as a cancer survivor after completing anti-cancer treatment.  He was offered support today through active listening and expressive supportive counseling.  He was given information regarding our available services and encouraged to contact me with any questions or for help enrolling in any of our support group/programs.       Nicholos Johns, PA-C

## 2020-01-29 DIAGNOSIS — G4733 Obstructive sleep apnea (adult) (pediatric): Secondary | ICD-10-CM | POA: Diagnosis not present

## 2020-01-29 DIAGNOSIS — D649 Anemia, unspecified: Secondary | ICD-10-CM | POA: Diagnosis not present

## 2020-01-29 DIAGNOSIS — C61 Malignant neoplasm of prostate: Secondary | ICD-10-CM | POA: Diagnosis not present

## 2020-01-29 DIAGNOSIS — I951 Orthostatic hypotension: Secondary | ICD-10-CM | POA: Diagnosis not present

## 2020-01-29 DIAGNOSIS — E119 Type 2 diabetes mellitus without complications: Secondary | ICD-10-CM | POA: Diagnosis not present

## 2020-01-29 DIAGNOSIS — R55 Syncope and collapse: Secondary | ICD-10-CM | POA: Diagnosis not present

## 2020-01-29 DIAGNOSIS — I10 Essential (primary) hypertension: Secondary | ICD-10-CM | POA: Diagnosis not present

## 2020-02-18 DIAGNOSIS — D649 Anemia, unspecified: Secondary | ICD-10-CM | POA: Diagnosis not present

## 2020-03-17 DIAGNOSIS — C61 Malignant neoplasm of prostate: Secondary | ICD-10-CM | POA: Diagnosis not present

## 2020-03-24 DIAGNOSIS — C61 Malignant neoplasm of prostate: Secondary | ICD-10-CM | POA: Diagnosis not present

## 2020-03-26 NOTE — Progress Notes (Signed)
  Radiation Oncology         [phone]) [phone] ________________________________  Name: Thomas Barnes MRN: [id number]  Date: 12/15/2019  DOB: [birth year]  End of Treatment Note  Diagnosis:   74 y.o. gentleman with rising, detectable PSA of 0.28 s/p RALP in 11/2017 for Stage pT3a, Gleason 4+5 prostate cancer.     Indication for treatment:  Curative, Definitive Radiotherapy       Radiation treatment dates:   10/23/19 - 12/15/19: (concurrent with ADT)  Site/dose:  1. The prostate fossa and pelvic lymph nodes were initially treated to 45 Gy in 25 fractions of 1.8 Gy  2. The prostate fossa only was boosted to 68.4 Gy with 13 additional fractions of 1.8 Gy   Beams/energy:  1. The prostate fossa  and pelvic lymph nodes were initially treated using VMAT intensity modulated radiotherapy delivering 6 megavolt photons. Image guidance was performed with CB-CT studies prior to each fraction. He was immobilized with a body fix lower extremity mold.  2. The prostate fossa only was boosted using VMAT intensity modulated radiotherapy delivering 6 megavolt photons. Image guidance was performed with CB-CT studies prior to each fraction. He was immobilized with a body fix lower extremity mold.  Narrative: The patient tolerated radiation treatment relatively well.   The patient experienced some minor urinary irritation with nocturia x3 and modest fatigue and hot flashes associated with ADT.     Plan: The patient has completed radiation treatment. He will return to radiation oncology clinic for routine followup in one month. I advised him to call or return sooner if he has any questions or concerns related to his recovery or treatment. ________________________________  Sheral Apley. Tammi Klippel, M.D.

## 2020-03-30 DIAGNOSIS — Z800 Family history of malignant neoplasm of digestive organs: Secondary | ICD-10-CM | POA: Diagnosis not present

## 2020-05-17 DIAGNOSIS — M5136 Other intervertebral disc degeneration, lumbar region: Secondary | ICD-10-CM | POA: Diagnosis not present

## 2020-05-17 DIAGNOSIS — Z96642 Presence of left artificial hip joint: Secondary | ICD-10-CM | POA: Diagnosis not present

## 2020-05-17 DIAGNOSIS — M25552 Pain in left hip: Secondary | ICD-10-CM | POA: Diagnosis not present

## 2020-06-01 DIAGNOSIS — I10 Essential (primary) hypertension: Secondary | ICD-10-CM | POA: Diagnosis not present

## 2020-06-01 DIAGNOSIS — G4733 Obstructive sleep apnea (adult) (pediatric): Secondary | ICD-10-CM | POA: Diagnosis not present

## 2020-06-01 DIAGNOSIS — E782 Mixed hyperlipidemia: Secondary | ICD-10-CM | POA: Diagnosis not present

## 2020-06-01 DIAGNOSIS — M109 Gout, unspecified: Secondary | ICD-10-CM | POA: Diagnosis not present

## 2020-06-01 DIAGNOSIS — E669 Obesity, unspecified: Secondary | ICD-10-CM | POA: Diagnosis not present

## 2020-06-01 DIAGNOSIS — E119 Type 2 diabetes mellitus without complications: Secondary | ICD-10-CM | POA: Diagnosis not present

## 2020-06-01 DIAGNOSIS — M1990 Unspecified osteoarthritis, unspecified site: Secondary | ICD-10-CM | POA: Diagnosis not present

## 2020-06-01 DIAGNOSIS — I8390 Asymptomatic varicose veins of unspecified lower extremity: Secondary | ICD-10-CM | POA: Diagnosis not present

## 2020-06-01 DIAGNOSIS — C61 Malignant neoplasm of prostate: Secondary | ICD-10-CM | POA: Diagnosis not present

## 2020-08-25 DIAGNOSIS — L812 Freckles: Secondary | ICD-10-CM | POA: Diagnosis not present

## 2020-08-25 DIAGNOSIS — L820 Inflamed seborrheic keratosis: Secondary | ICD-10-CM | POA: Diagnosis not present

## 2020-08-25 DIAGNOSIS — L821 Other seborrheic keratosis: Secondary | ICD-10-CM | POA: Diagnosis not present

## 2020-08-25 DIAGNOSIS — L720 Epidermal cyst: Secondary | ICD-10-CM | POA: Diagnosis not present

## 2020-08-25 DIAGNOSIS — D1801 Hemangioma of skin and subcutaneous tissue: Secondary | ICD-10-CM | POA: Diagnosis not present

## 2020-10-08 DIAGNOSIS — C61 Malignant neoplasm of prostate: Secondary | ICD-10-CM | POA: Diagnosis not present

## 2020-10-15 DIAGNOSIS — C61 Malignant neoplasm of prostate: Secondary | ICD-10-CM | POA: Diagnosis not present

## 2020-11-24 DIAGNOSIS — H2513 Age-related nuclear cataract, bilateral: Secondary | ICD-10-CM | POA: Diagnosis not present

## 2020-11-24 DIAGNOSIS — E119 Type 2 diabetes mellitus without complications: Secondary | ICD-10-CM | POA: Diagnosis not present

## 2020-11-24 DIAGNOSIS — H52203 Unspecified astigmatism, bilateral: Secondary | ICD-10-CM | POA: Diagnosis not present

## 2021-01-05 DIAGNOSIS — E785 Hyperlipidemia, unspecified: Secondary | ICD-10-CM | POA: Diagnosis not present

## 2021-01-05 DIAGNOSIS — M109 Gout, unspecified: Secondary | ICD-10-CM | POA: Diagnosis not present

## 2021-01-05 DIAGNOSIS — Z125 Encounter for screening for malignant neoplasm of prostate: Secondary | ICD-10-CM | POA: Diagnosis not present

## 2021-01-05 DIAGNOSIS — E119 Type 2 diabetes mellitus without complications: Secondary | ICD-10-CM | POA: Diagnosis not present

## 2021-01-06 DIAGNOSIS — L720 Epidermal cyst: Secondary | ICD-10-CM | POA: Diagnosis not present

## 2021-01-06 DIAGNOSIS — D485 Neoplasm of uncertain behavior of skin: Secondary | ICD-10-CM | POA: Diagnosis not present

## 2021-01-10 DIAGNOSIS — R82998 Other abnormal findings in urine: Secondary | ICD-10-CM | POA: Diagnosis not present

## 2021-01-20 DIAGNOSIS — C61 Malignant neoplasm of prostate: Secondary | ICD-10-CM | POA: Diagnosis not present

## 2021-01-20 DIAGNOSIS — M109 Gout, unspecified: Secondary | ICD-10-CM | POA: Diagnosis not present

## 2021-01-20 DIAGNOSIS — E119 Type 2 diabetes mellitus without complications: Secondary | ICD-10-CM | POA: Diagnosis not present

## 2021-01-20 DIAGNOSIS — E782 Mixed hyperlipidemia: Secondary | ICD-10-CM | POA: Diagnosis not present

## 2021-01-20 DIAGNOSIS — M1990 Unspecified osteoarthritis, unspecified site: Secondary | ICD-10-CM | POA: Diagnosis not present

## 2021-01-20 DIAGNOSIS — I8390 Asymptomatic varicose veins of unspecified lower extremity: Secondary | ICD-10-CM | POA: Diagnosis not present

## 2021-01-20 DIAGNOSIS — Z0000 Encounter for general adult medical examination without abnormal findings: Secondary | ICD-10-CM | POA: Diagnosis not present

## 2021-01-20 DIAGNOSIS — I10 Essential (primary) hypertension: Secondary | ICD-10-CM | POA: Diagnosis not present

## 2021-01-20 DIAGNOSIS — E669 Obesity, unspecified: Secondary | ICD-10-CM | POA: Diagnosis not present

## 2021-05-18 DIAGNOSIS — C61 Malignant neoplasm of prostate: Secondary | ICD-10-CM | POA: Diagnosis not present

## 2021-05-18 DIAGNOSIS — J069 Acute upper respiratory infection, unspecified: Secondary | ICD-10-CM | POA: Diagnosis not present

## 2021-05-18 DIAGNOSIS — I10 Essential (primary) hypertension: Secondary | ICD-10-CM | POA: Diagnosis not present

## 2021-05-18 DIAGNOSIS — Z1152 Encounter for screening for COVID-19: Secondary | ICD-10-CM | POA: Diagnosis not present

## 2021-05-18 DIAGNOSIS — R059 Cough, unspecified: Secondary | ICD-10-CM | POA: Diagnosis not present

## 2021-05-18 DIAGNOSIS — E119 Type 2 diabetes mellitus without complications: Secondary | ICD-10-CM | POA: Diagnosis not present

## 2021-05-25 DIAGNOSIS — N393 Stress incontinence (female) (male): Secondary | ICD-10-CM | POA: Diagnosis not present

## 2021-05-25 DIAGNOSIS — C61 Malignant neoplasm of prostate: Secondary | ICD-10-CM | POA: Diagnosis not present

## 2021-07-25 DIAGNOSIS — M109 Gout, unspecified: Secondary | ICD-10-CM | POA: Diagnosis not present

## 2021-07-25 DIAGNOSIS — E669 Obesity, unspecified: Secondary | ICD-10-CM | POA: Diagnosis not present

## 2021-07-25 DIAGNOSIS — G4733 Obstructive sleep apnea (adult) (pediatric): Secondary | ICD-10-CM | POA: Diagnosis not present

## 2021-07-25 DIAGNOSIS — E782 Mixed hyperlipidemia: Secondary | ICD-10-CM | POA: Diagnosis not present

## 2021-07-25 DIAGNOSIS — I10 Essential (primary) hypertension: Secondary | ICD-10-CM | POA: Diagnosis not present

## 2021-07-25 DIAGNOSIS — M1990 Unspecified osteoarthritis, unspecified site: Secondary | ICD-10-CM | POA: Diagnosis not present

## 2021-07-25 DIAGNOSIS — E785 Hyperlipidemia, unspecified: Secondary | ICD-10-CM | POA: Diagnosis not present

## 2021-07-25 DIAGNOSIS — E119 Type 2 diabetes mellitus without complications: Secondary | ICD-10-CM | POA: Diagnosis not present

## 2021-07-25 DIAGNOSIS — C61 Malignant neoplasm of prostate: Secondary | ICD-10-CM | POA: Diagnosis not present

## 2021-09-15 DIAGNOSIS — I10 Essential (primary) hypertension: Secondary | ICD-10-CM | POA: Diagnosis not present

## 2021-09-15 DIAGNOSIS — E669 Obesity, unspecified: Secondary | ICD-10-CM | POA: Diagnosis not present

## 2021-09-15 DIAGNOSIS — E119 Type 2 diabetes mellitus without complications: Secondary | ICD-10-CM | POA: Diagnosis not present

## 2021-09-15 DIAGNOSIS — R232 Flushing: Secondary | ICD-10-CM | POA: Diagnosis not present

## 2021-10-05 DIAGNOSIS — L820 Inflamed seborrheic keratosis: Secondary | ICD-10-CM | POA: Diagnosis not present

## 2021-10-05 DIAGNOSIS — L718 Other rosacea: Secondary | ICD-10-CM | POA: Diagnosis not present

## 2021-10-11 ENCOUNTER — Other Ambulatory Visit: Payer: Self-pay

## 2021-10-11 DIAGNOSIS — I872 Venous insufficiency (chronic) (peripheral): Secondary | ICD-10-CM

## 2021-10-13 ENCOUNTER — Ambulatory Visit: Payer: Medicare PPO | Admitting: Vascular Surgery

## 2021-10-13 ENCOUNTER — Encounter: Payer: Self-pay | Admitting: Vascular Surgery

## 2021-10-13 ENCOUNTER — Other Ambulatory Visit: Payer: Self-pay

## 2021-10-13 ENCOUNTER — Ambulatory Visit (HOSPITAL_COMMUNITY)
Admission: RE | Admit: 2021-10-13 | Discharge: 2021-10-13 | Disposition: A | Discharge status: Home / Self Care | Payer: Medicare PPO | Source: Ambulatory Visit | Attending: Vascular Surgery | Admitting: Vascular Surgery

## 2021-10-13 VITALS — BP 146/80 | HR 64 | Temp 97.2°F | Resp 18 | Ht 70.0 in | Wt 217.1 lb

## 2021-10-13 DIAGNOSIS — I872 Venous insufficiency (chronic) (peripheral): Secondary | ICD-10-CM

## 2021-10-13 DIAGNOSIS — I8393 Asymptomatic varicose veins of bilateral lower extremities: Secondary | ICD-10-CM

## 2021-10-13 NOTE — Progress Notes (Signed)
Patient ID: Thomas Barnes, male   DOB: [birth year], 76 y.o.   MRN: [id number]  Reason for Consult: New Patient (Initial Visit) (C/o large varicosities left thigh and calf for 4 months  no swelling or pain )   Referred by Shon Baton, MD  Subjective:     HPI:  Thomas Barnes is a 76 y.o. male with a fairly recent history of left thigh and leg medial varicosities.  These are quite uncomfortable to him in fact touching on the medial knee causing significant pain.  He denies any history of DVT.  He has had a left hip replacement.  Does not report call his parents having any varicosities.  He does not work on his feet for long periods of time.  He is also had some skin discoloration of the left ankle but no tissue loss or ulceration.  He is able to walk without limitation but does have some discomfort of the varicosities with walking.  He also has associated leg swelling.  Past Medical History:  Diagnosis Date   Arthritis    DDD (degenerative disc disease), cervical    C5-C6   Elevated PSA    History of kidney stones    History of strep sore throat    approx 2 weeks ago    Hyperlipidemia    Hypertension    OSA (obstructive sleep apnea)    per pt study done 2011 --  intolerent CPAP-    Prostate cancer ([place])    Family History  Problem Relation Age of Onset   Ovarian cancer Mother    Prostate cancer Father    Heart disease Paternal Grandfather    Breast cancer Neg Hx    Colon cancer Neg Hx    Pancreatic cancer Neg Hx    Past Surgical History:  Procedure Laterality Date   COLONOSCOPY  last one 2013  approx   CYSTOSCOPY/URETEROSCOPY/HOLMIUM LASER/STENT PLACEMENT Left 05/22/2018   Procedure: CYSTOSCOPY/URETEROSCOPY/RETROGRADE/HOLMIUM LASER/STENT PLACEMENT;  Surgeon: Raynelle Bring, MD;  Location: [facility];  Service: Urology;  Laterality: Left;  ONLY NEEDS 45 MIN   EXTRACORPOREAL SHOCK WAVE LITHOTRIPSY Left 01/21/2018   Procedure: LEFT EXTRACORPOREAL SHOCK WAVE LITHOTRIPSY (ESWL);   Surgeon: Raynelle Bring, MD;  Location: [facility];  Service: Urology;  Laterality: Left;   EXTRACORPOREAL SHOCK WAVE LITHOTRIPSY Left 03/18/2018   Procedure: LEFT EXTRACORPOREAL SHOCK WAVE LITHOTRIPSY (ESWL);  Surgeon: Raynelle Bring, MD;  Location: [facility];  Service: Urology;  Laterality: Left;   LYMPHADENECTOMY Bilateral 11/22/2017   Procedure: LYMPHADENECTOMY, PELVIC;  Surgeon: Raynelle Bring, MD;  Location: [facility];  Service: Urology;  Laterality: Bilateral;   PROSTATE BIOPSY N/A 05/25/2016   Procedure: BIOPSY TRANSRECTAL ULTRASONIC PROSTATE (TUBP);  Surgeon: Raynelle Bring, MD;  Location: [facility];  Service: Urology;  Laterality: N/A;   PROSTATE BIOPSY N/A 09/27/2017   Procedure: BIOPSY TRANSRECTAL ULTRASONIC PROSTATE (TUBP);  Surgeon: Raynelle Bring, MD;  Location: [facility];  Service: Urology;  Laterality: N/A;   ROBOT ASSISTED LAPAROSCOPIC RADICAL PROSTATECTOMY N/A 11/22/2017   Procedure: XI ROBOTIC ASSISTED LAPAROSCOPIC RADICAL PROSTATECTOMY LEVEL 2;  Surgeon: Raynelle Bring, MD;  Location: [facility];  Service: Urology;  Laterality: N/A;   TONSILLECTOMY  age 47   TOTAL HIP ARTHROPLASTY Left 06/06/2017   Procedure: LEFT TOTAL HIP ARTHROPLASTY ANTERIOR APPROACH;  Surgeon: Gaynelle Arabian, MD;  Location: [facility];  Service: Orthopedics;  Laterality: Left;   TRANSRECTAL ULTRASOUND N/A 09/27/2017   Procedure: TRANSRECTAL ULTRASOUND;  Surgeon: Raynelle Bring, MD;  Location: [facility];  Service: Urology;  Laterality: N/A;  ONLY NEEDS 45 MIN FOR PROCEDURE    Short Social History:  Social History   Tobacco Use   Smoking status: Never   Smokeless tobacco: Never  Substance Use Topics   Alcohol use: No    Allergies  Allergen Reactions   Penicillins Hives    Childhood allergy - tolerates Ancef Has patient had a PCN reaction causing immediate rash, facial/tongue/throat swelling, SOB or lightheadedness with hypotension: No Has patient had a PCN reaction causing severe rash involving mucus membranes  or skin necrosis: Yes Has patient had a PCN reaction that required hospitalization: No Has patient had a PCN reaction occurring within the last 10 years: No If all of the above answers are "NO", then may proceed with Cephalosporin use.     Current Outpatient Medications  Medication Sig Dispense Refill   allopurinol (ZYLOPRIM) 300 MG tablet Take 300 mg by mouth every evening.      aspirin EC 81 MG tablet Take 81 mg by mouth daily.     Cholecalciferol (VITAMIN D3) 25 MCG (1000 UT) CAPS Take by mouth.     doxycycline (VIBRAMYCIN) 100 MG capsule Take 100 mg by mouth 2 (two) times daily.     lisinopril (PRINIVIL,ZESTRIL) 10 MG tablet Take 10 mg by mouth daily.     metFORMIN (GLUCOPHAGE) 500 MG tablet 500 mg daily.     Omega-3 Fatty Acids (FISH OIL) 1200 MG CAPS Take 1,200 mg by mouth daily.     simvastatin (ZOCOR) 40 MG tablet Take 40 mg by mouth daily.      acetaminophen (TYLENOL) 500 MG tablet Take 500 mg by mouth every 6 (six) hours as needed (for pain.).  (Patient not taking: Reported on 10/13/2021)     aspirin-acetaminophen-caffeine (EXCEDRIN MIGRAINE) 250-250-65 MG tablet Take 1 tablet by mouth every 6 (six) hours as needed for headache. (Patient not taking: Reported on 10/13/2021)     megestrol (MEGACE) 20 MG tablet Take 1 tablet (20 mg total) by mouth 2 (two) times daily. (Patient not taking: Reported on 10/13/2021) 60 tablet 5   No current facility-administered medications for this visit.   Facility-Administered Medications Ordered in Other Visits  Medication Dose Route Frequency Provider Last Rate Last Admin   sodium phosphate (FLEET) 7-19 GM/118ML enema 1 enema  1 enema Rectal Once Raynelle Bring, MD        Review of Systems  Constitutional:  Constitutional negative. HENT: HENT negative.  Eyes: Eyes negative.  Respiratory: Respiratory negative.  Cardiovascular: Positive for leg swelling.  GI: Gastrointestinal negative.  Musculoskeletal: Positive for leg pain.  Skin: Skin  negative.  Neurological: Neurological negative. Hematologic: Hematologic/lymphatic negative.  Psychiatric: Psychiatric negative.       Objective:  Objective   Vitals:   10/13/21 0833  BP: (!) 146/80  Pulse: 64  Resp: 18  Temp: (!) 97.2 F (36.2 C)  TempSrc: Temporal  SpO2: 100%  Weight: 217 lb 1.6 oz (98.5 kg)  Height: 5\' 10"  (1.778 m)   Body mass index is 31.15 kg/m.  Physical Exam HENT:     Head: Normocephalic.     Nose:     Comments: Wearing a mask Eyes:     Pupils: Pupils are equal, round, and reactive to light.  Cardiovascular:     Rate and Rhythm: Normal rate.  Pulmonary:     Effort: Pulmonary effort is normal.  Abdominal:     General: Abdomen is flat.     Palpations: Abdomen is soft.  Musculoskeletal:     Cervical back: Normal range of motion.     Right lower leg: No edema.     Left lower leg: Edema present.  Skin:    Capillary Refill: Capillary refill takes less than 2 seconds.     Comments: Mild discoloration of the left leg and ankle as pictured below with associated varicosities on the medial thigh and leg  Neurological:     General: No focal deficit present.     Mental Status: He is alert.  Psychiatric:        Mood and Affect: Mood normal.        Behavior: Behavior normal.     Data:  LEFT           Reflux No Reflux Reflux Time Diameter cms Comments                                            Yes                                                     +--------------+---------+------+-----------+------------+-----------------  ----+   CFV                       yes    >1 second                                        +--------------+---------+------+-----------+------------+-----------------  ----+   FV mid         no                                                                 +--------------+---------+------+-----------+------------+-----------------  ----+   Popliteal      no                                                                  +--------------+---------+------+-----------+------------+-----------------  ----+   GSV at [facility]                yes     >500 ms       0.94                              +--------------+---------+------+-----------+------------+-----------------  ----+   GSV prox thigh            yes     >500 ms       0.57                              +--------------+---------+------+-----------+------------+-----------------  ----+   GSV mid thigh  yes     >500 ms       0.54     large varicose  branch                                                             comes off GSV  mid                                                                 thigh and  continues                                                               into calf                 +--------------+---------+------+-----------+------------+-----------------  ----+   GSV dist thigh no                               0.23                              +--------------+---------+------+-----------+------------+-----------------  ----+   GSV at knee    no                               0.21                              +--------------+---------+------+-----------+------------+-----------------  ----+   GSV prox calf  no                               0.25                              +--------------+---------+------+-----------+------------+-----------------  ----+   SSV Pop Fossa  no                               0.19                              +--------------+---------+------+-----------+------------+-----------------  ----+   SSV prox calf  no                               0.17                              +--------------+---------+------+-----------+------------+-----------------  ----+   SSV mid calf   no  0.23                              +--------------+---------+------+-----------+------------+-----------------  ----+    Summary:  Left:  - No evidence of deep vein thrombosis seen  in the left lower extremity,  from the common femoral through the popliteal veins.  - No evidence of superficial venous thrombosis in the left lower  extremity.     - Venous reflux is noted in the left common femoral vein.  - Venous reflux is noted in the left sapheno-femoral junction.  - Venous reflux is noted in the left greater saphenous vein in the thigh.      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  We will fit the patient for thigh-high stockings today we discussed the timing to wear these.  He will follow-up in 3 months to discuss left greater saphenous vein ablation with possible stab phlebectomy to prevent further swelling and skin discoloration and to remove his incompetent saphenous vein and pain from varicosities.  Patient demonstrates good understanding of the expected course with his wife present today.     Waynetta Sandy MD [facility]

## 2021-11-30 DIAGNOSIS — H2513 Age-related nuclear cataract, bilateral: Secondary | ICD-10-CM | POA: Diagnosis not present

## 2021-11-30 DIAGNOSIS — E119 Type 2 diabetes mellitus without complications: Secondary | ICD-10-CM | POA: Diagnosis not present

## 2021-11-30 DIAGNOSIS — H52203 Unspecified astigmatism, bilateral: Secondary | ICD-10-CM | POA: Diagnosis not present

## 2021-12-23 DIAGNOSIS — C61 Malignant neoplasm of prostate: Secondary | ICD-10-CM | POA: Diagnosis not present

## 2021-12-30 DIAGNOSIS — E349 Endocrine disorder, unspecified: Secondary | ICD-10-CM | POA: Diagnosis not present

## 2021-12-30 DIAGNOSIS — C61 Malignant neoplasm of prostate: Secondary | ICD-10-CM | POA: Diagnosis not present

## 2022-01-13 DIAGNOSIS — I10 Essential (primary) hypertension: Secondary | ICD-10-CM | POA: Diagnosis not present

## 2022-01-13 DIAGNOSIS — E119 Type 2 diabetes mellitus without complications: Secondary | ICD-10-CM | POA: Diagnosis not present

## 2022-01-13 DIAGNOSIS — E669 Obesity, unspecified: Secondary | ICD-10-CM | POA: Diagnosis not present

## 2022-01-13 DIAGNOSIS — E782 Mixed hyperlipidemia: Secondary | ICD-10-CM | POA: Diagnosis not present

## 2022-01-13 DIAGNOSIS — M109 Gout, unspecified: Secondary | ICD-10-CM | POA: Diagnosis not present

## 2022-01-17 DIAGNOSIS — Z0000 Encounter for general adult medical examination without abnormal findings: Secondary | ICD-10-CM | POA: Diagnosis not present

## 2022-01-17 DIAGNOSIS — R82998 Other abnormal findings in urine: Secondary | ICD-10-CM | POA: Diagnosis not present

## 2022-01-18 ENCOUNTER — Encounter: Payer: Self-pay | Admitting: Vascular Surgery

## 2022-01-18 ENCOUNTER — Ambulatory Visit: Payer: Medicare PPO | Admitting: Vascular Surgery

## 2022-01-18 VITALS — BP 94/62 | HR 87 | Temp 97.9°F | Resp 20 | Ht 70.0 in | Wt 206.0 lb

## 2022-01-18 DIAGNOSIS — I872 Venous insufficiency (chronic) (peripheral): Secondary | ICD-10-CM | POA: Diagnosis not present

## 2022-01-18 NOTE — Progress Notes (Signed)
Patient ID: Thomas Barnes, male   DOB: Jan 17, 1946, 76 y.o.   MRN: [id number]  Reason for Consult: Follow-up   Referred by Shon Baton, MD  Subjective:     HPI:  Thomas Barnes is a 76 y.o. 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  He has not had any bleeding issues but does have pain it causes him to stop during the day.  He has not had any bleeding or thrombosis.  He has been compliant with thigh-high compression stockings since our evaluation in February.  No previous history of DVT.  No previous venous interventions.  He has stable discoloration of the left ankle without any history of ulceration.  Past Medical History:  Diagnosis Date   Arthritis    DDD (degenerative disc disease), cervical    C5-C6   Elevated PSA    History of kidney stones    History of strep sore throat    approx 2 weeks ago    Hyperlipidemia    Hypertension    OSA (obstructive sleep apnea)    per pt study done 2011 --  intolerent CPAP-    Prostate cancer ([place])    Family History  Problem Relation Age of Onset   Ovarian cancer Mother    Prostate cancer Father    Heart disease Paternal Grandfather    Breast cancer Neg Hx    Colon cancer Neg Hx    Pancreatic cancer Neg Hx    Past Surgical History:  Procedure Laterality Date   COLONOSCOPY  last one 2013  approx   CYSTOSCOPY/URETEROSCOPY/HOLMIUM LASER/STENT PLACEMENT Left 05/22/2018   Procedure: CYSTOSCOPY/URETEROSCOPY/RETROGRADE/HOLMIUM LASER/STENT PLACEMENT;  Surgeon: Raynelle Bring, MD;  Location: [facility];  Service: Urology;  Laterality: Left;  ONLY NEEDS 45 MIN   EXTRACORPOREAL SHOCK WAVE LITHOTRIPSY Left 01/21/2018   Procedure: LEFT EXTRACORPOREAL SHOCK WAVE LITHOTRIPSY (ESWL);  Surgeon: Raynelle Bring, MD;  Location: [facility];  Service: Urology;  Laterality: Left;   EXTRACORPOREAL SHOCK WAVE LITHOTRIPSY Left 03/18/2018   Procedure: LEFT EXTRACORPOREAL SHOCK WAVE LITHOTRIPSY (ESWL);  Surgeon: Raynelle Bring, MD;  Location: [facility];   Service: Urology;  Laterality: Left;   LYMPHADENECTOMY Bilateral 11/22/2017   Procedure: LYMPHADENECTOMY, PELVIC;  Surgeon: Raynelle Bring, MD;  Location: [facility];  Service: Urology;  Laterality: Bilateral;   PROSTATE BIOPSY N/A 05/25/2016   Procedure: BIOPSY TRANSRECTAL ULTRASONIC PROSTATE (TUBP);  Surgeon: Raynelle Bring, MD;  Location: [facility];  Service: Urology;  Laterality: N/A;   PROSTATE BIOPSY N/A 09/27/2017   Procedure: BIOPSY TRANSRECTAL ULTRASONIC PROSTATE (TUBP);  Surgeon: Raynelle Bring, MD;  Location: [facility];  Service: Urology;  Laterality: N/A;   ROBOT ASSISTED LAPAROSCOPIC RADICAL PROSTATECTOMY N/A 11/22/2017   Procedure: XI ROBOTIC ASSISTED LAPAROSCOPIC RADICAL PROSTATECTOMY LEVEL 2;  Surgeon: Raynelle Bring, MD;  Location: [facility];  Service: Urology;  Laterality: N/A;   TONSILLECTOMY  age 5   TOTAL HIP ARTHROPLASTY Left 06/06/2017   Procedure: LEFT TOTAL HIP ARTHROPLASTY ANTERIOR APPROACH;  Surgeon: Gaynelle Arabian, MD;  Location: [facility];  Service: Orthopedics;  Laterality: Left;   TRANSRECTAL ULTRASOUND N/A 09/27/2017   Procedure: TRANSRECTAL ULTRASOUND;  Surgeon: Raynelle Bring, MD;  Location: [facility];  Service: Urology;  Laterality: N/A;  ONLY NEEDS 45 MIN FOR PROCEDURE    Short Social History:  Social History   Tobacco Use   Smoking status: Never   Smokeless tobacco: Never  Substance Use Topics   Alcohol use: No    Allergies  Allergen Reactions  Penicillins Hives    Childhood allergy - tolerates Ancef Has patient had a PCN reaction causing immediate rash, facial/tongue/throat swelling, SOB or lightheadedness with hypotension: No Has patient had a PCN reaction causing severe rash involving mucus membranes or skin necrosis: Yes Has patient had a PCN reaction that required hospitalization: No Has patient had a PCN reaction occurring within the last 10 years: No If all of the above answers are "NO", then may proceed with Cephalosporin use.     Current  Outpatient Medications  Medication Sig Dispense Refill   allopurinol (ZYLOPRIM) 300 MG tablet Take 300 mg by mouth every evening.      aspirin EC 81 MG tablet Take 81 mg by mouth daily.     aspirin-acetaminophen-caffeine (EXCEDRIN MIGRAINE) 250-250-65 MG tablet Take 1 tablet by mouth every 6 (six) hours as needed for headache.     Cholecalciferol (VITAMIN D3) 25 MCG (1000 UT) CAPS Take by mouth.     lisinopril (PRINIVIL,ZESTRIL) 10 MG tablet Take 10 mg by mouth daily.     metFORMIN (GLUCOPHAGE) 500 MG tablet 500 mg daily.     Omega-3 Fatty Acids (FISH OIL) 1200 MG CAPS Take 1,200 mg by mouth daily.     simvastatin (ZOCOR) 40 MG tablet Take 40 mg by mouth daily.      No current facility-administered medications for this visit.   Facility-Administered Medications Ordered in Other Visits  Medication Dose Route Frequency Provider Last Rate Last Admin   sodium phosphate (FLEET) 7-19 GM/118ML enema 1 enema  1 enema Rectal Once Raynelle Bring, MD        Review of Systems  Constitutional:  Constitutional negative. HENT: HENT negative.  Eyes: Eyes negative.  Respiratory: Respiratory negative.  Cardiovascular: Positive for leg swelling.  GI: Gastrointestinal negative.  Musculoskeletal: Musculoskeletal negative.  Skin: Skin negative.  Neurological: Neurological negative. Hematologic: Hematologic/lymphatic negative.  Psychiatric: Psychiatric negative.       Objective:  Objective   Vitals:   01/18/22 1055  BP: 94/62  Pulse: 87  Resp: 20  Temp: 97.9 F (36.6 C)  SpO2: 98%  Weight: 206 lb (93.4 kg)  Height: '5\' 10"'$  (1.778 m)   Body mass index is 29.56 kg/m.  Physical Exam HENT:     Head: Normocephalic.  Eyes:     Pupils: Pupils are equal, round, and reactive to light.  Cardiovascular:     Rate and Rhythm: Normal rate.     Pulses: Normal pulses.  Pulmonary:     Effort: Pulmonary effort is normal.  Abdominal:     General: Abdomen is flat.     Palpations: Abdomen is soft.   Musculoskeletal:     Cervical back: Normal range of motion.     Right lower leg: No edema.     Left lower leg: No edema.  Skin:    Capillary Refill: Capillary refill takes less than 2 seconds.     Comments: Skin of the left lower extremity appears improved from a discoloration standpoint today there is stable varicose veins on the medial thigh  Neurological:     General: No focal deficit present.     Mental Status: He is alert.  Psychiatric:        Mood and Affect: Mood normal.    Data: LEFT          Reflux NoRefluxReflux TimeDiameter cmsComments  Yes                                                 +--------------+---------+------+-----------+------------+-----------------  ----+  CFV                     yes   >1 second                                     +--------------+---------+------+-----------+------------+-----------------  ----+  FV mid        no                                                            +--------------+---------+------+-----------+------------+-----------------  ----+  Popliteal     no                                                            +--------------+---------+------+-----------+------------+-----------------  ----+  GSV at [facility]              yes    >500 ms      0.94                            +--------------+---------+------+-----------+------------+-----------------  ----+  GSV prox thigh          yes    >500 ms      0.57                            +--------------+---------+------+-----------+------------+-----------------  ----+  GSV mid thigh           yes    >500 ms      0.54    large varicose  branch                                                      comes off GSV  mid                                                          thigh and  continues                                                        into calf                 +--------------+---------+------+-----------+------------+-----------------  ----+  GSV dist thighno                            0.23                            +--------------+---------+------+-----------+------------+-----------------  ----+  GSV at knee   no                            0.21                            +--------------+---------+------+-----------+------------+-----------------  ----+  GSV prox calf no                            0.25                            +--------------+---------+------+-----------+------------+-----------------  ----+  SSV Pop Fossa no                            0.19                            +--------------+---------+------+-----------+------------+-----------------  ----+  SSV prox calf no                            0.17                            +--------------+---------+------+-----------+------------+-----------------  ----+  SSV mid calf  no                            0.23                            +--------------+---------+------+-----------+------------+-----------------  ----+           Summary:  Left:  - No evidence of deep vein thrombosis seen in the left lower extremity,  from the common femoral through the popliteal veins.  - No evidence of superficial venous thrombosis in the left lower  extremity.     - Venous reflux is noted in the left common femoral vein.  - Venous reflux is noted in the left sapheno-femoral junction.  - Venous reflux is noted in the left greater saphenous vein in the thigh.      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  The plan will be for great saphenous vein ablation likely to stick above the knee and then for stab phlebectomy  totaling 10-20.  He will continue thigh-high compression stockings.  We discussed the risk and benefits as well as alternatives and he demonstrates good understanding we will get him scheduled in the near future.     Waynetta Sandy MD [facility]

## 2022-01-20 DIAGNOSIS — M109 Gout, unspecified: Secondary | ICD-10-CM | POA: Diagnosis not present

## 2022-01-20 DIAGNOSIS — Z1331 Encounter for screening for depression: Secondary | ICD-10-CM | POA: Diagnosis not present

## 2022-01-20 DIAGNOSIS — Z8546 Personal history of malignant neoplasm of prostate: Secondary | ICD-10-CM | POA: Diagnosis not present

## 2022-01-20 DIAGNOSIS — G4733 Obstructive sleep apnea (adult) (pediatric): Secondary | ICD-10-CM | POA: Diagnosis not present

## 2022-01-20 DIAGNOSIS — Z0000 Encounter for general adult medical examination without abnormal findings: Secondary | ICD-10-CM | POA: Diagnosis not present

## 2022-01-20 DIAGNOSIS — I10 Essential (primary) hypertension: Secondary | ICD-10-CM | POA: Diagnosis not present

## 2022-01-20 DIAGNOSIS — E669 Obesity, unspecified: Secondary | ICD-10-CM | POA: Diagnosis not present

## 2022-01-20 DIAGNOSIS — E119 Type 2 diabetes mellitus without complications: Secondary | ICD-10-CM | POA: Diagnosis not present

## 2022-01-20 DIAGNOSIS — E782 Mixed hyperlipidemia: Secondary | ICD-10-CM | POA: Diagnosis not present

## 2022-02-15 ENCOUNTER — Other Ambulatory Visit: Payer: Self-pay | Admitting: *Deleted

## 2022-02-15 DIAGNOSIS — I83812 Varicose veins of left lower extremities with pain: Secondary | ICD-10-CM

## 2022-03-01 DIAGNOSIS — E349 Endocrine disorder, unspecified: Secondary | ICD-10-CM | POA: Diagnosis not present

## 2022-05-10 DIAGNOSIS — E349 Endocrine disorder, unspecified: Secondary | ICD-10-CM | POA: Diagnosis not present

## 2022-05-31 ENCOUNTER — Other Ambulatory Visit: Payer: Self-pay | Admitting: *Deleted

## 2022-05-31 MED ORDER — LORAZEPAM 1 MG PO TABS: ORAL_TABLET | ORAL | 0 refills | Status: AC

## 2022-06-07 ENCOUNTER — Ambulatory Visit: Payer: Medicare PPO | Admitting: Podiatry

## 2022-06-08 ENCOUNTER — Encounter: Payer: Self-pay | Admitting: Vascular Surgery

## 2022-06-08 ENCOUNTER — Ambulatory Visit: Payer: Medicare PPO | Admitting: Vascular Surgery

## 2022-06-08 VITALS — BP 121/69 | HR 72 | Temp 97.6°F | Resp 16 | Ht 68.0 in | Wt 212.0 lb

## 2022-06-08 DIAGNOSIS — I872 Venous insufficiency (chronic) (peripheral): Secondary | ICD-10-CM

## 2022-06-08 HISTORY — PX: ENDOVENOUS ABLATION SAPHENOUS VEIN W/ LASER: SUR449

## 2022-06-08 NOTE — Progress Notes (Signed)
Patient name: Thomas Barnes MRN: [id number] DOB: September 29, 1945 Sex: male  REASON FOR VISIT: Left greater saphenous vein ablation and stab phlebectomy  HPI: Thomas Barnes is a 76 y.o. male with history of C4 venous disease with multiple symptomatic varicosities on the left medial knee.  He has been compliant with thigh-high compression stockings and presents today for treatment of left lower extremity chronic venous insufficiency.  Current Outpatient Medications  Medication Sig Dispense Refill   allopurinol (ZYLOPRIM) 300 MG tablet Take 300 mg by mouth every evening.      aspirin EC 81 MG tablet Take 81 mg by mouth daily.     aspirin-acetaminophen-caffeine (EXCEDRIN MIGRAINE) 250-250-65 MG tablet Take 1 tablet by mouth every 6 (six) hours as needed for headache.     Cholecalciferol (VITAMIN D3) 25 MCG (1000 UT) CAPS Take by mouth.     lisinopril (PRINIVIL,ZESTRIL) 10 MG tablet Take 10 mg by mouth daily.     LORazepam (ATIVAN) 1 MG tablet Take 1 tablet 30 minutes prior to leaving house on day of office surgery.  Bring second tablet with you to office on day of office surgery. 2 tablet 0   metFORMIN (GLUCOPHAGE) 500 MG tablet 500 mg daily.     Omega-3 Fatty Acids (FISH OIL) 1200 MG CAPS Take 1,200 mg by mouth daily.     simvastatin (ZOCOR) 40 MG tablet Take 40 mg by mouth daily.      No current facility-administered medications for this visit.   Facility-Administered Medications Ordered in Other Visits  Medication Dose Route Frequency Provider Last Rate Last Admin   sodium phosphate (FLEET) 7-19 GM/118ML enema 1 enema  1 enema Rectal Once Raynelle Bring, MD        PHYSICAL EXAM: Vitals:   06/08/22 1053  BP: 121/69  Pulse: 72  Resp: 16  Temp: 97.6 F (36.4 C)  SpO2: 96%    Awake alert and oriented Left lower extremity varicosities marked with the patient in the standing position and left greater saphenous vein mapped with the patient supine using ultrasound    PROCEDURE:  Endovenous laser ablation left greater saphenous vein totaling 18 cm and stab phlebectomy left lower extremity totaling between 10 and 20   TECHNIQUE: The patient's veins were marked with him in the standing position.  The left greater saphenous vein was marked with the patient supine.  He was then prepped and draped in the left lower extremity in the usual fashion and a timeout was called and we all confirmed left lower extremity as the correct side.  The left greater saphenous vein was then identified with ultrasound and the area was anesthetized with 1% lidocaine and this was cannulated in the distal 1/3 of the thigh with a micropuncture needle followed by wire and a micropuncture sheath.  A J-wire was then placed 3.5 cm from the saphenofemoral junction which was easily visualized.  A 45 cm laser sheath was then brought and placed at the level of the J-wire also just 3.5 cm from the saphenofemoral junction.  We then anesthetized with tumescent anesthesia along the level of the expected laser path and then performed laser ablation for a total of 18 cm.  At completion the left common femoral vein was patent and easily compressed and the left greater saphenous vein did appear sclerotic throughout its course.  Attention was then turned to the multiple areas of stab phlebectomy.  All areas were first anesthetized with 1% lidocaine and then instilled with  tumescent anesthesia.  Between 10 and 20 stab phlebectomy incisions were performed at the left medial knee as well as the left medial leg anterior and lateral ankle.  At completion a sterile dressing was placed followed by compressive wrap.  He tolerated the procedure well without immediate complications.  Plan will be for follow-up in 2 weeks with left lower extremity post ablation duplex to rule out DVT.   Servando Snare [facility] [place]

## 2022-06-08 NOTE — Progress Notes (Signed)
     Laser Ablation Procedure     Date: 06/08/2022   Thomas Barnes DOB:[birth year]  Consent signed: Yes      Surgeon: Servando Snare MD   Procedure: Laser Ablation: left Greater Saphenous Vein  BP 121/69 (BP Location: Left Arm, Patient Position: Sitting, Cuff Size: Normal)   Pulse 72   Temp 97.6 F (36.4 C) (Temporal)   Resp 16   Ht '5\' 8"'$  (1.727 m)   Wt 212 lb (96.2 kg)   SpO2 96%   BMI 32.23 kg/m   Tumescent Anesthesia: 500 cc 0.9% NaCl with 50 cc Lidocaine HCL 1%  and 15 cc 8.4% NaHCO3  Local Anesthesia: 30 cc Lidocaine HCL and NaHCO3 (ratio 2:1)  7 watts continuous mode     Total energy: 903 Joules     Total time: 129 seconds  Treatment Length  18 cm   Laser Fiber Ref. #  [id number]    Lot #  [id number]    Stab Phlebectomy: 10-20 Sites: Thigh, Calf, and Ankle  Patient tolerated procedure well  Notes:  All staff members wore facial masks.  Mr. Uhlig took Ativan 1 mg (1 tablet) on 06-08-2022 at 9:40 AM and at 10:40 AM.    Description of Procedure:  After marking the course of the secondary varicosities, the patient was placed on the operating table in the supine position, and the left leg was prepped and draped in sterile fashion.   Local anesthetic was administered and under ultrasound guidance the saphenous vein was accessed with a micro needle and guide wire; then the mirco puncture sheath was placed.  A guide wire was inserted saphenofemoral junction , followed by a 5 french sheath.  The position of the sheath and then the laser fiber below the junction was confirmed using the ultrasound.  Tumescent anesthesia was administered along the course of the saphenous vein using ultrasound guidance. The patient was placed in Trendelenburg position and protective laser glasses were placed on patient and staff, and the laser was fired at 7 watts continuous mode for a total of 903 joules.   For stab phlebectomies, local anesthetic was administered at the previously marked  varicosities, and tumescent anesthesia was administered around the vessels.  Ten to 20 stab wounds were made using the tip of an 11 blade. And using the vein hook, the phlebectomies were performed using a hemostat to avulse the varicosities.  Adequate hemostasis was achieved.     Steri strips were applied to the stab wounds and ABD pads and thigh high compression stockings were applied.  Ace wrap bandages were applied over the phlebectomy sites and at the top of the saphenofemoral junction. Blood loss was less than 15 cc.  Discharge instructions reviewed with patient and hardcopy of discharge instructions given to patient to take home. The patient ambulated out of the operating room having tolerated the procedure well.

## 2022-06-14 ENCOUNTER — Ambulatory Visit: Payer: Self-pay | Admitting: Podiatry

## 2022-06-22 ENCOUNTER — Encounter: Payer: Self-pay | Admitting: Vascular Surgery

## 2022-06-22 ENCOUNTER — Ambulatory Visit (INDEPENDENT_AMBULATORY_CARE_PROVIDER_SITE_OTHER): Payer: Medicare PPO | Admitting: Vascular Surgery

## 2022-06-22 ENCOUNTER — Ambulatory Visit (HOSPITAL_COMMUNITY)
Admission: RE | Admit: 2022-06-22 | Discharge: 2022-06-22 | Disposition: A | Discharge status: Home / Self Care | Payer: Medicare PPO | Source: Ambulatory Visit | Attending: Vascular Surgery | Admitting: Vascular Surgery

## 2022-06-22 VITALS — BP 151/80 | HR 67 | Temp 97.9°F | Resp 16 | Ht 70.0 in | Wt 210.0 lb

## 2022-06-22 DIAGNOSIS — I872 Venous insufficiency (chronic) (peripheral): Secondary | ICD-10-CM

## 2022-06-22 DIAGNOSIS — I83812 Varicose veins of left lower extremities with pain: Secondary | ICD-10-CM | POA: Insufficient documentation

## 2022-06-22 NOTE — Progress Notes (Signed)
Subjective:     Patient ID: Thomas Barnes, male   DOB: Nov 27, 1945, 76 y.o.   MRN: [id number]  HPI 76-year male status post left lower extremity great saphenous vein ablation and stab phlebectomy between 10 and 20.  He has had significant pain but relieved with ice and ibuprofen.  No significant swelling of the left lower extremity.   Review of Systems Left leg pain    Objective:   Physical Exam Vitals:   06/22/22 1001  BP: (!) 151/80  Pulse: 67  Resp: 16  Temp: 97.9 F (36.6 C)  SpO2: 99%   Awake alert oriented Nonlabored respirations Left lower extremity with multiple Steri-Strips in place No swelling left lower extremity Palpable dorsalis pedis pulse on the left  Left:  - No evidence of deep vein thrombosis seen in the left lower extremity,  from the common femoral through the popliteal veins. The Greater Saphenous  vein is closed from the distal thigh to within 3.8 cm from the  sapheno-femoral junction.     Assessment:     76 year old male status post left great saphenous vein ablation and stab phlebectomy with expected postoperative pain and bruising.    Plan:     Continue compression stockings until pain and swelling resolved  Follow-up as needed.     Corina Stacy C. Randie Heinz, MD [facility] [place] Office: [phone] Pager: [phone]

## 2022-06-28 DIAGNOSIS — R948 Abnormal results of function studies of other organs and systems: Secondary | ICD-10-CM | POA: Diagnosis not present

## 2022-06-28 DIAGNOSIS — E349 Endocrine disorder, unspecified: Secondary | ICD-10-CM | POA: Diagnosis not present

## 2022-07-05 ENCOUNTER — Ambulatory Visit: Payer: Medicare PPO | Admitting: Podiatry

## 2022-07-05 ENCOUNTER — Encounter: Payer: Self-pay | Admitting: Podiatry

## 2022-07-05 DIAGNOSIS — L600 Ingrowing nail: Secondary | ICD-10-CM

## 2022-07-05 DIAGNOSIS — E349 Endocrine disorder, unspecified: Secondary | ICD-10-CM | POA: Diagnosis not present

## 2022-07-05 DIAGNOSIS — C61 Malignant neoplasm of prostate: Secondary | ICD-10-CM | POA: Diagnosis not present

## 2022-07-05 NOTE — Patient Instructions (Signed)

## 2022-07-05 NOTE — Progress Notes (Signed)
Subjective:   Patient ID: Thomas Barnes, male   DOB: 76 y.o.   MRN: [id number]   HPI Patient presents stating his left hallux nail has been splitting and while its not tender he is concerned about it stating it had long-term trauma and for the last couple months has developed a split.  Patient does not smoke likes to be active   Review of Systems  All other systems reviewed and are negative.       Objective:  Physical Exam Vitals and nursing note reviewed.  Constitutional:      Appearance: He is well-developed.  Pulmonary:     Effort: Pulmonary effort is normal.  Musculoskeletal:        General: Normal range of motion.  Skin:    General: Skin is warm.  Neurological:     Mental Status: He is alert.     Neurovascular status intact muscle strength adequate range of motion adequate with patient found to have a split left hallux nail distal portion with moderate thickness of the bed and no other pathology noted.  Patient has good digital perfusion well-oriented     Assessment:  Traumatic disease to the left hallux nail that has occurred     Plan:  H&P reviewed condition went ahead today and debrided the distal portion of the nailbed discussed that if it continues to do this becomes a problem nail removal may be an option in the future and I educated him on permanent nail removal

## 2022-07-28 DIAGNOSIS — G4733 Obstructive sleep apnea (adult) (pediatric): Secondary | ICD-10-CM | POA: Diagnosis not present

## 2022-07-28 DIAGNOSIS — E669 Obesity, unspecified: Secondary | ICD-10-CM | POA: Diagnosis not present

## 2022-07-28 DIAGNOSIS — I10 Essential (primary) hypertension: Secondary | ICD-10-CM | POA: Diagnosis not present

## 2022-07-28 DIAGNOSIS — E785 Hyperlipidemia, unspecified: Secondary | ICD-10-CM | POA: Diagnosis not present

## 2022-07-28 DIAGNOSIS — E119 Type 2 diabetes mellitus without complications: Secondary | ICD-10-CM | POA: Diagnosis not present

## 2022-07-28 DIAGNOSIS — E782 Mixed hyperlipidemia: Secondary | ICD-10-CM | POA: Diagnosis not present

## 2022-07-28 DIAGNOSIS — M1990 Unspecified osteoarthritis, unspecified site: Secondary | ICD-10-CM | POA: Diagnosis not present

## 2022-07-28 DIAGNOSIS — M109 Gout, unspecified: Secondary | ICD-10-CM | POA: Diagnosis not present

## 2022-07-28 DIAGNOSIS — Z8546 Personal history of malignant neoplasm of prostate: Secondary | ICD-10-CM | POA: Diagnosis not present

## 2022-08-02 DIAGNOSIS — R310 Gross hematuria: Secondary | ICD-10-CM | POA: Diagnosis not present

## 2022-08-04 DIAGNOSIS — N3041 Irradiation cystitis with hematuria: Secondary | ICD-10-CM | POA: Diagnosis not present

## 2022-08-04 DIAGNOSIS — R310 Gross hematuria: Secondary | ICD-10-CM | POA: Diagnosis not present

## 2022-11-29 DIAGNOSIS — H52203 Unspecified astigmatism, bilateral: Secondary | ICD-10-CM | POA: Diagnosis not present

## 2022-11-29 DIAGNOSIS — H2513 Age-related nuclear cataract, bilateral: Secondary | ICD-10-CM | POA: Diagnosis not present

## 2022-11-29 DIAGNOSIS — E119 Type 2 diabetes mellitus without complications: Secondary | ICD-10-CM | POA: Diagnosis not present

## 2022-12-18 DIAGNOSIS — E785 Hyperlipidemia, unspecified: Secondary | ICD-10-CM | POA: Diagnosis not present

## 2022-12-18 DIAGNOSIS — R7989 Other specified abnormal findings of blood chemistry: Secondary | ICD-10-CM | POA: Diagnosis not present

## 2022-12-18 DIAGNOSIS — I10 Essential (primary) hypertension: Secondary | ICD-10-CM | POA: Diagnosis not present

## 2022-12-18 DIAGNOSIS — M109 Gout, unspecified: Secondary | ICD-10-CM | POA: Diagnosis not present

## 2022-12-18 DIAGNOSIS — Z125 Encounter for screening for malignant neoplasm of prostate: Secondary | ICD-10-CM | POA: Diagnosis not present

## 2022-12-18 DIAGNOSIS — Z1212 Encounter for screening for malignant neoplasm of rectum: Secondary | ICD-10-CM | POA: Diagnosis not present

## 2022-12-18 DIAGNOSIS — D649 Anemia, unspecified: Secondary | ICD-10-CM | POA: Diagnosis not present

## 2022-12-18 DIAGNOSIS — E119 Type 2 diabetes mellitus without complications: Secondary | ICD-10-CM | POA: Diagnosis not present

## 2022-12-20 DIAGNOSIS — R82998 Other abnormal findings in urine: Secondary | ICD-10-CM | POA: Diagnosis not present

## 2022-12-20 DIAGNOSIS — I10 Essential (primary) hypertension: Secondary | ICD-10-CM | POA: Diagnosis not present

## 2022-12-25 DIAGNOSIS — M109 Gout, unspecified: Secondary | ICD-10-CM | POA: Diagnosis not present

## 2022-12-25 DIAGNOSIS — I10 Essential (primary) hypertension: Secondary | ICD-10-CM | POA: Diagnosis not present

## 2022-12-25 DIAGNOSIS — E785 Hyperlipidemia, unspecified: Secondary | ICD-10-CM | POA: Diagnosis not present

## 2022-12-25 DIAGNOSIS — E782 Mixed hyperlipidemia: Secondary | ICD-10-CM | POA: Diagnosis not present

## 2022-12-25 DIAGNOSIS — M1990 Unspecified osteoarthritis, unspecified site: Secondary | ICD-10-CM | POA: Diagnosis not present

## 2022-12-25 DIAGNOSIS — E119 Type 2 diabetes mellitus without complications: Secondary | ICD-10-CM | POA: Diagnosis not present

## 2022-12-25 DIAGNOSIS — Z0000 Encounter for general adult medical examination without abnormal findings: Secondary | ICD-10-CM | POA: Diagnosis not present

## 2022-12-25 DIAGNOSIS — C61 Malignant neoplasm of prostate: Secondary | ICD-10-CM | POA: Diagnosis not present

## 2022-12-25 DIAGNOSIS — E669 Obesity, unspecified: Secondary | ICD-10-CM | POA: Diagnosis not present

## 2023-01-03 DIAGNOSIS — C61 Malignant neoplasm of prostate: Secondary | ICD-10-CM | POA: Diagnosis not present

## 2023-01-10 DIAGNOSIS — N3041 Irradiation cystitis with hematuria: Secondary | ICD-10-CM | POA: Diagnosis not present

## 2023-01-10 DIAGNOSIS — C61 Malignant neoplasm of prostate: Secondary | ICD-10-CM | POA: Diagnosis not present

## 2023-03-01 DIAGNOSIS — N3001 Acute cystitis with hematuria: Secondary | ICD-10-CM | POA: Diagnosis not present

## 2023-07-09 DIAGNOSIS — R232 Flushing: Secondary | ICD-10-CM | POA: Diagnosis not present

## 2023-07-09 DIAGNOSIS — M109 Gout, unspecified: Secondary | ICD-10-CM | POA: Diagnosis not present

## 2023-07-09 DIAGNOSIS — C61 Malignant neoplasm of prostate: Secondary | ICD-10-CM | POA: Diagnosis not present

## 2023-07-09 DIAGNOSIS — E669 Obesity, unspecified: Secondary | ICD-10-CM | POA: Diagnosis not present

## 2023-07-09 DIAGNOSIS — E785 Hyperlipidemia, unspecified: Secondary | ICD-10-CM | POA: Diagnosis not present

## 2023-07-09 DIAGNOSIS — Z8546 Personal history of malignant neoplasm of prostate: Secondary | ICD-10-CM | POA: Diagnosis not present

## 2023-07-09 DIAGNOSIS — I10 Essential (primary) hypertension: Secondary | ICD-10-CM | POA: Diagnosis not present

## 2023-07-09 DIAGNOSIS — E119 Type 2 diabetes mellitus without complications: Secondary | ICD-10-CM | POA: Diagnosis not present

## 2023-07-10 DIAGNOSIS — C61 Malignant neoplasm of prostate: Secondary | ICD-10-CM | POA: Diagnosis not present

## 2023-07-10 DIAGNOSIS — E349 Endocrine disorder, unspecified: Secondary | ICD-10-CM | POA: Diagnosis not present

## 2023-07-12 DIAGNOSIS — M25551 Pain in right hip: Secondary | ICD-10-CM | POA: Diagnosis not present

## 2023-07-12 DIAGNOSIS — Z96642 Presence of left artificial hip joint: Secondary | ICD-10-CM | POA: Diagnosis not present

## 2023-08-10 DIAGNOSIS — E119 Type 2 diabetes mellitus without complications: Secondary | ICD-10-CM | POA: Diagnosis not present

## 2023-08-10 DIAGNOSIS — H2513 Age-related nuclear cataract, bilateral: Secondary | ICD-10-CM | POA: Diagnosis not present

## 2023-09-06 DIAGNOSIS — Z961 Presence of intraocular lens: Secondary | ICD-10-CM | POA: Diagnosis not present

## 2023-09-06 DIAGNOSIS — H25812 Combined forms of age-related cataract, left eye: Secondary | ICD-10-CM | POA: Diagnosis not present

## 2023-09-06 DIAGNOSIS — H2512 Age-related nuclear cataract, left eye: Secondary | ICD-10-CM | POA: Diagnosis not present

## 2023-09-20 DIAGNOSIS — Z961 Presence of intraocular lens: Secondary | ICD-10-CM | POA: Diagnosis not present

## 2023-09-20 DIAGNOSIS — H2511 Age-related nuclear cataract, right eye: Secondary | ICD-10-CM | POA: Diagnosis not present

## 2023-09-20 DIAGNOSIS — H25811 Combined forms of age-related cataract, right eye: Secondary | ICD-10-CM | POA: Diagnosis not present

## 2023-10-01 ENCOUNTER — Other Ambulatory Visit (HOSPITAL_COMMUNITY): Payer: Self-pay

## 2023-10-01 MED ORDER — TESTOSTERONE 20.25 MG/ACT (1.62%) TD GEL
3.0000 | Freq: Every morning | TRANSDERMAL | 5 refills | Status: AC
  Filled 2023-10-01: qty 225, 90d supply, fill #0
  Filled 2023-10-15: qty 225, 60d supply, fill #0

## 2023-10-02 ENCOUNTER — Other Ambulatory Visit (HOSPITAL_COMMUNITY): Payer: Self-pay

## 2023-10-04 ENCOUNTER — Other Ambulatory Visit (HOSPITAL_COMMUNITY): Payer: Self-pay

## 2023-10-04 MED ORDER — ALLOPURINOL 300 MG PO TABS
300.0000 mg | ORAL_TABLET | Freq: Every day | ORAL | 3 refills | Status: AC
  Filled 2023-10-04: qty 90, 90d supply, fill #0
  Filled 2024-03-29: qty 90, 90d supply, fill #1
  Filled 2024-08-03: qty 90, 90d supply, fill #2

## 2023-10-04 MED ORDER — METFORMIN HCL 500 MG PO TABS
500.0000 mg | ORAL_TABLET | Freq: Every day | ORAL | 3 refills | Status: DC
  Filled 2023-10-04: qty 90, 90d supply, fill #0

## 2023-10-04 MED ORDER — LISINOPRIL 10 MG PO TABS
5.0000 mg | ORAL_TABLET | Freq: Every day | ORAL | 1 refills | Status: DC
  Filled 2023-11-12: qty 45, 90d supply, fill #0

## 2023-10-04 MED ORDER — SIMVASTATIN 40 MG PO TABS
40.0000 mg | ORAL_TABLET | Freq: Every day | ORAL | 1 refills | Status: DC
  Filled 2023-11-12: qty 90, 90d supply, fill #0

## 2023-10-12 ENCOUNTER — Other Ambulatory Visit (HOSPITAL_COMMUNITY): Payer: Self-pay

## 2023-10-15 ENCOUNTER — Other Ambulatory Visit (HOSPITAL_COMMUNITY): Payer: Self-pay

## 2023-11-12 ENCOUNTER — Other Ambulatory Visit (HOSPITAL_COMMUNITY): Payer: Self-pay

## 2023-12-20 DIAGNOSIS — I10 Essential (primary) hypertension: Secondary | ICD-10-CM | POA: Diagnosis not present

## 2023-12-20 DIAGNOSIS — D649 Anemia, unspecified: Secondary | ICD-10-CM | POA: Diagnosis not present

## 2023-12-20 DIAGNOSIS — E119 Type 2 diabetes mellitus without complications: Secondary | ICD-10-CM | POA: Diagnosis not present

## 2023-12-20 DIAGNOSIS — Z1212 Encounter for screening for malignant neoplasm of rectum: Secondary | ICD-10-CM | POA: Diagnosis not present

## 2023-12-20 DIAGNOSIS — R319 Hematuria, unspecified: Secondary | ICD-10-CM | POA: Diagnosis not present

## 2023-12-20 DIAGNOSIS — E7849 Other hyperlipidemia: Secondary | ICD-10-CM | POA: Diagnosis not present

## 2023-12-20 DIAGNOSIS — M109 Gout, unspecified: Secondary | ICD-10-CM | POA: Diagnosis not present

## 2023-12-20 DIAGNOSIS — Z125 Encounter for screening for malignant neoplasm of prostate: Secondary | ICD-10-CM | POA: Diagnosis not present

## 2023-12-20 DIAGNOSIS — E782 Mixed hyperlipidemia: Secondary | ICD-10-CM | POA: Diagnosis not present

## 2023-12-20 DIAGNOSIS — Z8546 Personal history of malignant neoplasm of prostate: Secondary | ICD-10-CM | POA: Diagnosis not present

## 2023-12-20 DIAGNOSIS — E785 Hyperlipidemia, unspecified: Secondary | ICD-10-CM | POA: Diagnosis not present

## 2023-12-24 DIAGNOSIS — R82998 Other abnormal findings in urine: Secondary | ICD-10-CM | POA: Diagnosis not present

## 2023-12-24 DIAGNOSIS — E782 Mixed hyperlipidemia: Secondary | ICD-10-CM | POA: Diagnosis not present

## 2023-12-24 DIAGNOSIS — I10 Essential (primary) hypertension: Secondary | ICD-10-CM | POA: Diagnosis not present

## 2023-12-24 DIAGNOSIS — E119 Type 2 diabetes mellitus without complications: Secondary | ICD-10-CM | POA: Diagnosis not present

## 2023-12-27 DIAGNOSIS — M109 Gout, unspecified: Secondary | ICD-10-CM | POA: Diagnosis not present

## 2023-12-27 DIAGNOSIS — E119 Type 2 diabetes mellitus without complications: Secondary | ICD-10-CM | POA: Diagnosis not present

## 2023-12-27 DIAGNOSIS — G4733 Obstructive sleep apnea (adult) (pediatric): Secondary | ICD-10-CM | POA: Diagnosis not present

## 2023-12-27 DIAGNOSIS — R232 Flushing: Secondary | ICD-10-CM | POA: Diagnosis not present

## 2023-12-27 DIAGNOSIS — Z8546 Personal history of malignant neoplasm of prostate: Secondary | ICD-10-CM | POA: Diagnosis not present

## 2023-12-27 DIAGNOSIS — E785 Hyperlipidemia, unspecified: Secondary | ICD-10-CM | POA: Diagnosis not present

## 2023-12-27 DIAGNOSIS — Z23 Encounter for immunization: Secondary | ICD-10-CM | POA: Diagnosis not present

## 2023-12-27 DIAGNOSIS — I10 Essential (primary) hypertension: Secondary | ICD-10-CM | POA: Diagnosis not present

## 2023-12-27 DIAGNOSIS — Z0000 Encounter for general adult medical examination without abnormal findings: Secondary | ICD-10-CM | POA: Diagnosis not present

## 2023-12-27 DIAGNOSIS — E669 Obesity, unspecified: Secondary | ICD-10-CM | POA: Diagnosis not present

## 2024-01-01 ENCOUNTER — Other Ambulatory Visit (HOSPITAL_COMMUNITY): Payer: Self-pay

## 2024-01-01 ENCOUNTER — Encounter (HOSPITAL_COMMUNITY): Payer: Self-pay

## 2024-01-02 ENCOUNTER — Other Ambulatory Visit (HOSPITAL_COMMUNITY): Payer: Self-pay

## 2024-01-02 MED ORDER — METFORMIN HCL 500 MG PO TABS
500.0000 mg | ORAL_TABLET | Freq: Every day | ORAL | 3 refills | Status: AC
  Filled 2024-01-02: qty 90, 90d supply, fill #0
  Filled 2024-03-29: qty 90, 90d supply, fill #1
  Filled 2024-07-06: qty 90, 90d supply, fill #2

## 2024-01-11 DIAGNOSIS — R948 Abnormal results of function studies of other organs and systems: Secondary | ICD-10-CM | POA: Diagnosis not present

## 2024-01-11 DIAGNOSIS — E349 Endocrine disorder, unspecified: Secondary | ICD-10-CM | POA: Diagnosis not present

## 2024-01-18 ENCOUNTER — Other Ambulatory Visit (HOSPITAL_COMMUNITY): Payer: Self-pay

## 2024-01-18 DIAGNOSIS — C61 Malignant neoplasm of prostate: Secondary | ICD-10-CM | POA: Diagnosis not present

## 2024-01-18 MED ORDER — TESTOSTERONE 20.25 MG/ACT (1.62%) TD GEL
TRANSDERMAL | 5 refills | Status: AC
  Filled 2024-01-18 – 2024-01-28 (×2): qty 225, 60d supply, fill #0
  Filled 2024-04-22: qty 225, 60d supply, fill #1
  Filled 2024-07-06: qty 225, 60d supply, fill #2

## 2024-01-28 ENCOUNTER — Other Ambulatory Visit (HOSPITAL_COMMUNITY): Payer: Self-pay

## 2024-01-29 ENCOUNTER — Other Ambulatory Visit: Payer: Self-pay

## 2024-02-05 ENCOUNTER — Other Ambulatory Visit (HOSPITAL_COMMUNITY): Payer: Self-pay

## 2024-02-06 ENCOUNTER — Other Ambulatory Visit (HOSPITAL_COMMUNITY): Payer: Self-pay

## 2024-02-06 MED ORDER — LISINOPRIL 10 MG PO TABS
ORAL_TABLET | ORAL | 2 refills | Status: AC
  Filled 2024-02-06: qty 18, 36d supply, fill #0
  Filled 2024-02-06: qty 45, 90d supply, fill #0
  Filled 2024-02-06: qty 27, 54d supply, fill #0

## 2024-02-20 ENCOUNTER — Other Ambulatory Visit (HOSPITAL_COMMUNITY): Payer: Self-pay

## 2024-02-20 MED ORDER — GLUCOSE BLOOD VI STRP
ORAL_STRIP | 3 refills | Status: AC
  Filled 2024-02-20 – 2024-02-21 (×2): qty 200, 90d supply, fill #0

## 2024-02-21 ENCOUNTER — Other Ambulatory Visit (HOSPITAL_COMMUNITY): Payer: Self-pay

## 2024-02-24 ENCOUNTER — Other Ambulatory Visit (HOSPITAL_COMMUNITY): Payer: Self-pay

## 2024-02-25 ENCOUNTER — Other Ambulatory Visit (HOSPITAL_COMMUNITY): Payer: Self-pay

## 2024-02-25 MED ORDER — SIMVASTATIN 40 MG PO TABS
40.0000 mg | ORAL_TABLET | Freq: Every day | ORAL | 1 refills | Status: DC
  Filled 2024-02-25: qty 90, 90d supply, fill #0
  Filled 2024-05-27: qty 90, 90d supply, fill #1

## 2024-02-28 ENCOUNTER — Other Ambulatory Visit (HOSPITAL_COMMUNITY): Payer: Self-pay

## 2024-03-07 ENCOUNTER — Other Ambulatory Visit (HOSPITAL_COMMUNITY): Payer: Self-pay

## 2024-03-08 ENCOUNTER — Other Ambulatory Visit (HOSPITAL_COMMUNITY): Payer: Self-pay

## 2024-03-13 DIAGNOSIS — Z6830 Body mass index (BMI) 30.0-30.9, adult: Secondary | ICD-10-CM | POA: Diagnosis not present

## 2024-03-13 DIAGNOSIS — M5416 Radiculopathy, lumbar region: Secondary | ICD-10-CM | POA: Diagnosis not present

## 2024-03-19 DIAGNOSIS — M5416 Radiculopathy, lumbar region: Secondary | ICD-10-CM | POA: Diagnosis not present

## 2024-03-19 DIAGNOSIS — L821 Other seborrheic keratosis: Secondary | ICD-10-CM | POA: Diagnosis not present

## 2024-03-19 DIAGNOSIS — L720 Epidermal cyst: Secondary | ICD-10-CM | POA: Diagnosis not present

## 2024-03-19 DIAGNOSIS — D1801 Hemangioma of skin and subcutaneous tissue: Secondary | ICD-10-CM | POA: Diagnosis not present

## 2024-04-15 DIAGNOSIS — M5416 Radiculopathy, lumbar region: Secondary | ICD-10-CM | POA: Diagnosis not present

## 2024-04-22 ENCOUNTER — Other Ambulatory Visit: Payer: Self-pay

## 2024-04-24 ENCOUNTER — Other Ambulatory Visit (HOSPITAL_COMMUNITY): Payer: Self-pay

## 2024-05-05 ENCOUNTER — Other Ambulatory Visit: Payer: Self-pay

## 2024-05-05 ENCOUNTER — Other Ambulatory Visit (HOSPITAL_COMMUNITY): Payer: Self-pay

## 2024-05-05 DIAGNOSIS — Z8546 Personal history of malignant neoplasm of prostate: Secondary | ICD-10-CM | POA: Diagnosis not present

## 2024-05-05 DIAGNOSIS — R052 Subacute cough: Secondary | ICD-10-CM | POA: Diagnosis not present

## 2024-05-05 DIAGNOSIS — J302 Other seasonal allergic rhinitis: Secondary | ICD-10-CM | POA: Diagnosis not present

## 2024-05-05 DIAGNOSIS — I10 Essential (primary) hypertension: Secondary | ICD-10-CM | POA: Diagnosis not present

## 2024-05-05 MED ORDER — LISINOPRIL 5 MG PO TABS
5.0000 mg | ORAL_TABLET | Freq: Every day | ORAL | 3 refills | Status: AC
  Filled 2024-05-05: qty 90, 90d supply, fill #0
  Filled 2024-08-03: qty 90, 90d supply, fill #1

## 2024-05-09 ENCOUNTER — Other Ambulatory Visit (HOSPITAL_COMMUNITY): Payer: Self-pay

## 2024-05-09 MED ORDER — AZITHROMYCIN 250 MG PO TABS
ORAL_TABLET | ORAL | 0 refills | Status: AC
  Filled 2024-05-09: qty 6, 5d supply, fill #0

## 2024-06-25 ENCOUNTER — Other Ambulatory Visit: Payer: Self-pay

## 2024-06-25 ENCOUNTER — Other Ambulatory Visit (HOSPITAL_COMMUNITY): Payer: Self-pay

## 2024-06-25 MED ORDER — ONETOUCH DELICA PLUS LANCET33G MISC
3 refills | Status: AC
  Filled 2024-06-25: qty 100, 50d supply, fill #0

## 2024-06-28 ENCOUNTER — Other Ambulatory Visit (HOSPITAL_COMMUNITY): Payer: Self-pay

## 2024-07-07 ENCOUNTER — Other Ambulatory Visit: Payer: Self-pay

## 2024-07-08 ENCOUNTER — Other Ambulatory Visit (HOSPITAL_COMMUNITY): Payer: Self-pay

## 2024-07-08 DIAGNOSIS — C61 Malignant neoplasm of prostate: Secondary | ICD-10-CM | POA: Diagnosis not present

## 2024-08-25 ENCOUNTER — Other Ambulatory Visit (HOSPITAL_COMMUNITY): Payer: Self-pay

## 2024-08-25 MED ORDER — SIMVASTATIN 40 MG PO TABS
40.0000 mg | ORAL_TABLET | Freq: Every day | ORAL | 2 refills | Status: AC
  Filled 2024-08-25: qty 90, 90d supply, fill #0

## 2024-09-01 ENCOUNTER — Other Ambulatory Visit (HOSPITAL_COMMUNITY): Payer: Self-pay

## 2024-09-03 ENCOUNTER — Other Ambulatory Visit (HOSPITAL_COMMUNITY): Payer: Self-pay

## 2024-09-04 ENCOUNTER — Other Ambulatory Visit (HOSPITAL_COMMUNITY): Payer: Self-pay

## 2024-09-04 MED ORDER — TESTOSTERONE 20.25 MG/ACT (1.62%) TD GEL
TRANSDERMAL | 5 refills | Status: AC
  Filled 2024-09-04: qty 225, 60d supply, fill #0

## 2024-09-08 ENCOUNTER — Other Ambulatory Visit (HOSPITAL_COMMUNITY): Payer: Self-pay
